# Patient Record
Sex: Male | Born: 1965 | Race: White | Hispanic: No | Marital: Single | Smoking: Current every day smoker
Health system: Southern US, Community
[De-identification: ages and names within clinical notes are randomized; demographics above are authoritative.]

## PROBLEM LIST (undated history)

## (undated) DIAGNOSIS — Z789 Other specified health status: Secondary | ICD-10-CM

## (undated) DIAGNOSIS — T8484XA Pain due to internal orthopedic prosthetic devices, implants and grafts, initial encounter: Secondary | ICD-10-CM

## (undated) DIAGNOSIS — N189 Chronic kidney disease, unspecified: Secondary | ICD-10-CM

## (undated) HISTORY — DX: Chronic kidney disease, unspecified: N18.9

---

## 1988-04-05 HISTORY — PX: HERNIA REPAIR: SHX51

## 1998-04-05 HISTORY — PX: ORIF ANKLE FRACTURE: SUR919

## 2002-01-06 ENCOUNTER — Inpatient Hospital Stay (HOSPITAL_COMMUNITY): Admission: AC | Admit: 2002-01-06 | Discharge: 2002-01-12 | Payer: Self-pay

## 2002-01-06 ENCOUNTER — Encounter: Payer: Self-pay | Admitting: Specialist

## 2003-04-06 HISTORY — PX: ORIF WRIST FRACTURE: SHX2133

## 2004-07-14 ENCOUNTER — Emergency Department (HOSPITAL_COMMUNITY): Admission: EM | Admit: 2004-07-14 | Discharge: 2004-07-14 | Payer: Self-pay | Admitting: Emergency Medicine

## 2004-07-16 ENCOUNTER — Inpatient Hospital Stay (HOSPITAL_COMMUNITY): Admission: RE | Admit: 2004-07-16 | Discharge: 2004-07-17 | Payer: Self-pay | Admitting: Orthopedic Surgery

## 2004-08-07 ENCOUNTER — Emergency Department (HOSPITAL_COMMUNITY): Admission: EM | Admit: 2004-08-07 | Discharge: 2004-08-07 | Payer: Self-pay | Admitting: Emergency Medicine

## 2012-08-01 ENCOUNTER — Encounter: Payer: Self-pay | Admitting: Medical

## 2012-10-05 ENCOUNTER — Other Ambulatory Visit: Payer: Self-pay | Admitting: Family Medicine

## 2012-10-05 DIAGNOSIS — N200 Calculus of kidney: Secondary | ICD-10-CM

## 2012-10-09 ENCOUNTER — Other Ambulatory Visit: Payer: Self-pay

## 2012-10-17 ENCOUNTER — Ambulatory Visit
Admission: RE | Admit: 2012-10-17 | Discharge: 2012-10-17 | Disposition: A | Discharge status: Home / Self Care | Payer: 59 | Source: Ambulatory Visit | Attending: Family Medicine | Admitting: Family Medicine

## 2012-10-17 DIAGNOSIS — N200 Calculus of kidney: Secondary | ICD-10-CM

## 2013-12-05 ENCOUNTER — Encounter (HOSPITAL_BASED_OUTPATIENT_CLINIC_OR_DEPARTMENT_OTHER): Payer: Self-pay | Admitting: *Deleted

## 2013-12-06 ENCOUNTER — Ambulatory Visit (HOSPITAL_BASED_OUTPATIENT_CLINIC_OR_DEPARTMENT_OTHER)
Admission: RE | Admit: 2013-12-06 | Discharge: 2013-12-06 | Disposition: A | Discharge status: Home / Self Care | Payer: 59 | Source: Ambulatory Visit | Attending: Orthopedic Surgery | Admitting: Orthopedic Surgery

## 2013-12-06 ENCOUNTER — Ambulatory Visit (HOSPITAL_BASED_OUTPATIENT_CLINIC_OR_DEPARTMENT_OTHER): Payer: 59 | Admitting: Anesthesiology

## 2013-12-06 ENCOUNTER — Encounter (HOSPITAL_BASED_OUTPATIENT_CLINIC_OR_DEPARTMENT_OTHER): Payer: Self-pay | Admitting: Anesthesiology

## 2013-12-06 ENCOUNTER — Encounter (HOSPITAL_BASED_OUTPATIENT_CLINIC_OR_DEPARTMENT_OTHER): Payer: 59 | Admitting: Anesthesiology

## 2013-12-06 ENCOUNTER — Encounter (HOSPITAL_BASED_OUTPATIENT_CLINIC_OR_DEPARTMENT_OTHER)
Admission: RE | Disposition: A | Discharge status: Home / Self Care | Payer: Self-pay | Source: Ambulatory Visit | Attending: Orthopedic Surgery

## 2013-12-06 DIAGNOSIS — M25579 Pain in unspecified ankle and joints of unspecified foot: Secondary | ICD-10-CM | POA: Diagnosis not present

## 2013-12-06 DIAGNOSIS — R52 Pain, unspecified: Secondary | ICD-10-CM | POA: Diagnosis not present

## 2013-12-06 DIAGNOSIS — T8484XA Pain due to internal orthopedic prosthetic devices, implants and grafts, initial encounter: Secondary | ICD-10-CM | POA: Diagnosis present

## 2013-12-06 DIAGNOSIS — T8484XS Pain due to internal orthopedic prosthetic devices, implants and grafts, sequela: Secondary | ICD-10-CM

## 2013-12-06 DIAGNOSIS — Z87891 Personal history of nicotine dependence: Secondary | ICD-10-CM | POA: Insufficient documentation

## 2013-12-06 DIAGNOSIS — L988 Other specified disorders of the skin and subcutaneous tissue: Secondary | ICD-10-CM | POA: Diagnosis not present

## 2013-12-06 DIAGNOSIS — Z472 Encounter for removal of internal fixation device: Secondary | ICD-10-CM | POA: Insufficient documentation

## 2013-12-06 HISTORY — PX: HARDWARE REMOVAL: SHX979

## 2013-12-06 HISTORY — DX: Pain due to internal orthopedic prosthetic devices, implants and grafts, initial encounter: T84.84XA

## 2013-12-06 HISTORY — DX: Other specified health status: Z78.9

## 2013-12-06 LAB — POCT HEMOGLOBIN-HEMACUE: HEMOGLOBIN: 15.1 g/dL (ref 13.0–17.0)

## 2013-12-06 SURGERY — REMOVAL, HARDWARE
Anesthesia: General | Site: Ankle | Laterality: Right

## 2013-12-06 MED ORDER — LACTATED RINGERS IV SOLN
INTRAVENOUS | Status: DC
  Administered 2013-12-06: 09:00:00 via INTRAVENOUS

## 2013-12-06 MED ORDER — HYDROMORPHONE HCL PF 1 MG/ML IJ SOLN
INTRAMUSCULAR | Status: AC
  Filled 2013-12-06: qty 1

## 2013-12-06 MED ORDER — SENNA-DOCUSATE SODIUM 8.6-50 MG PO TABS: 2.0000 | ORAL_TABLET | Freq: Every day | ORAL | Status: DC

## 2013-12-06 MED ORDER — FENTANYL CITRATE 0.05 MG/ML IJ SOLN
INTRAMUSCULAR | Status: AC
  Filled 2013-12-06: qty 6

## 2013-12-06 MED ORDER — ONDANSETRON HCL 4 MG/2ML IJ SOLN: 4.0000 mg | Freq: Once | INTRAMUSCULAR | Status: DC | PRN

## 2013-12-06 MED ORDER — OXYCODONE HCL 5 MG PO TABS
ORAL_TABLET | ORAL | Status: AC
  Filled 2013-12-06: qty 1

## 2013-12-06 MED ORDER — CEFAZOLIN SODIUM-DEXTROSE 2-3 GM-% IV SOLR
INTRAVENOUS | Status: AC
  Filled 2013-12-06: qty 50

## 2013-12-06 MED ORDER — MIDAZOLAM HCL 2 MG/2ML IJ SOLN
INTRAMUSCULAR | Status: AC
  Filled 2013-12-06: qty 2

## 2013-12-06 MED ORDER — ONDANSETRON HCL 4 MG/2ML IJ SOLN
INTRAMUSCULAR | Status: DC | PRN
  Administered 2013-12-06: 4 mg via INTRAVENOUS

## 2013-12-06 MED ORDER — FENTANYL CITRATE 0.05 MG/ML IJ SOLN
INTRAMUSCULAR | Status: DC | PRN
  Administered 2013-12-06: 100 ug via INTRAVENOUS

## 2013-12-06 MED ORDER — BUPIVACAINE HCL (PF) 0.5 % IJ SOLN
INTRAMUSCULAR | Status: DC | PRN
  Administered 2013-12-06: 19 mL

## 2013-12-06 MED ORDER — FENTANYL CITRATE 0.05 MG/ML IJ SOLN: 50.0000 ug | INTRAMUSCULAR | Status: DC | PRN

## 2013-12-06 MED ORDER — CEFAZOLIN SODIUM-DEXTROSE 2-3 GM-% IV SOLR
2.0000 g | INTRAVENOUS | Status: AC
  Administered 2013-12-06: 2 g via INTRAVENOUS

## 2013-12-06 MED ORDER — HYDROMORPHONE HCL PF 1 MG/ML IJ SOLN
0.2500 mg | INTRAMUSCULAR | Status: DC | PRN
  Administered 2013-12-06 (×3): 0.5 mg via INTRAVENOUS

## 2013-12-06 MED ORDER — OXYCODONE HCL 5 MG/5ML PO SOLN: 5.0000 mg | Freq: Once | ORAL | Status: AC | PRN

## 2013-12-06 MED ORDER — PROPOFOL 10 MG/ML IV BOLUS
INTRAVENOUS | Status: DC | PRN
  Administered 2013-12-06: 160 mg via INTRAVENOUS

## 2013-12-06 MED ORDER — LIDOCAINE HCL (CARDIAC) 20 MG/ML IV SOLN
INTRAVENOUS | Status: DC | PRN
  Administered 2013-12-06: 60 mg via INTRAVENOUS

## 2013-12-06 MED ORDER — CEPHALEXIN 500 MG PO CAPS: 500.0000 mg | ORAL_CAPSULE | Freq: Four times a day (QID) | ORAL | Status: DC

## 2013-12-06 MED ORDER — ONDANSETRON HCL 4 MG PO TABS: 4.0000 mg | ORAL_TABLET | Freq: Three times a day (TID) | ORAL | Status: DC | PRN

## 2013-12-06 MED ORDER — MEPERIDINE HCL 25 MG/ML IJ SOLN
6.2500 mg | INTRAMUSCULAR | Status: DC | PRN
Start: 2013-12-06 — End: 2013-12-06

## 2013-12-06 MED ORDER — MIDAZOLAM HCL 2 MG/2ML IJ SOLN: 1.0000 mg | INTRAMUSCULAR | Status: DC | PRN

## 2013-12-06 MED ORDER — OXYCODONE HCL 5 MG PO TABS
5.0000 mg | ORAL_TABLET | Freq: Once | ORAL | Status: AC | PRN
  Administered 2013-12-06: 5 mg via ORAL

## 2013-12-06 MED ORDER — OXYCODONE-ACETAMINOPHEN 5-325 MG PO TABS: 1.0000 | ORAL_TABLET | Freq: Four times a day (QID) | ORAL | Status: DC | PRN

## 2013-12-06 MED ORDER — DEXAMETHASONE SODIUM PHOSPHATE 4 MG/ML IJ SOLN
INTRAMUSCULAR | Status: DC | PRN
  Administered 2013-12-06: 10 mg via INTRAVENOUS

## 2013-12-06 MED ORDER — MIDAZOLAM HCL 5 MG/5ML IJ SOLN
INTRAMUSCULAR | Status: DC | PRN
  Administered 2013-12-06: 2 mg via INTRAVENOUS

## 2013-12-06 SURGICAL SUPPLY — 67 items
BAG DECANTER FOR FLEXI CONT (MISCELLANEOUS) IMPLANT
BANDAGE ELASTIC 4 VELCRO ST LF (GAUZE/BANDAGES/DRESSINGS) ×2 IMPLANT
BANDAGE ELASTIC 6 VELCRO ST LF (GAUZE/BANDAGES/DRESSINGS) IMPLANT
BANDAGE ESMARK 6X9 LF (GAUZE/BANDAGES/DRESSINGS) ×1 IMPLANT
BENZOIN TINCTURE PRP APPL 2/3 (GAUZE/BANDAGES/DRESSINGS) ×2 IMPLANT
BLADE SURG 15 STRL LF DISP TIS (BLADE) ×3 IMPLANT
BLADE SURG 15 STRL SS (BLADE) ×3
BNDG ESMARK 6X9 LF (GAUZE/BANDAGES/DRESSINGS) ×2
BOOT STEPPER DURA MED (SOFTGOODS) ×2 IMPLANT
CANISTER SUCT 1200ML W/VALVE (MISCELLANEOUS) IMPLANT
CLSR STERI-STRIP ANTIMIC 1/2X4 (GAUZE/BANDAGES/DRESSINGS) ×2 IMPLANT
COVER TABLE BACK 60X90 (DRAPES) IMPLANT
CUFF TOURNIQUET SINGLE 18IN (TOURNIQUET CUFF) IMPLANT
CUFF TOURNIQUET SINGLE 34IN LL (TOURNIQUET CUFF) ×2 IMPLANT
DECANTER SPIKE VIAL GLASS SM (MISCELLANEOUS) IMPLANT
DRAPE EXTREMITY T 121X128X90 (DRAPE) ×2 IMPLANT
DRAPE INCISE IOBAN 66X45 STRL (DRAPES) IMPLANT
DRAPE OEC MINIVIEW 54X84 (DRAPES) ×2 IMPLANT
DRAPE U-SHAPE 47X51 STRL (DRAPES) ×2 IMPLANT
DRAPE U-SHAPE 76X120 STRL (DRAPES) IMPLANT
DURAPREP 26ML APPLICATOR (WOUND CARE) ×2 IMPLANT
ELECT REM PT RETURN 9FT ADLT (ELECTROSURGICAL) ×2
ELECTRODE REM PT RTRN 9FT ADLT (ELECTROSURGICAL) ×1 IMPLANT
GAUZE SPONGE 4X4 12PLY STRL (GAUZE/BANDAGES/DRESSINGS) ×2 IMPLANT
GAUZE XEROFORM 1X8 LF (GAUZE/BANDAGES/DRESSINGS) ×2 IMPLANT
GLOVE BIO SURGEON STRL SZ8 (GLOVE) ×2 IMPLANT
GLOVE BIOGEL PI IND STRL 7.0 (GLOVE) ×1 IMPLANT
GLOVE BIOGEL PI IND STRL 8 (GLOVE) ×3 IMPLANT
GLOVE BIOGEL PI INDICATOR 7.0 (GLOVE) ×1
GLOVE BIOGEL PI INDICATOR 8 (GLOVE) ×3
GLOVE ORTHO TXT STRL SZ7.5 (GLOVE) ×2 IMPLANT
GOWN STRL REUS W/ TWL LRG LVL3 (GOWN DISPOSABLE) ×1 IMPLANT
GOWN STRL REUS W/ TWL XL LVL3 (GOWN DISPOSABLE) ×2 IMPLANT
GOWN STRL REUS W/TWL LRG LVL3 (GOWN DISPOSABLE) ×1
GOWN STRL REUS W/TWL XL LVL3 (GOWN DISPOSABLE) ×2
NDL SUT 6 .5 CRC .975X.05 MAYO (NEEDLE) IMPLANT
NEEDLE MAYO TAPER (NEEDLE)
NS IRRIG 1000ML POUR BTL (IV SOLUTION) ×2 IMPLANT
PACK ARTHROSCOPY DSU (CUSTOM PROCEDURE TRAY) ×2 IMPLANT
PACK BASIN DAY SURGERY FS (CUSTOM PROCEDURE TRAY) ×2 IMPLANT
PAD CAST 4YDX4 CTTN HI CHSV (CAST SUPPLIES) IMPLANT
PADDING CAST COTTON 4X4 STRL (CAST SUPPLIES)
PADDING CAST COTTON 6X4 STRL (CAST SUPPLIES) IMPLANT
PENCIL BUTTON HOLSTER BLD 10FT (ELECTRODE) ×2 IMPLANT
SHEET MEDIUM DRAPE 40X70 STRL (DRAPES) ×2 IMPLANT
SLEEVE SCD COMPRESS KNEE MED (MISCELLANEOUS) ×2 IMPLANT
SPONGE LAP 4X18 X RAY DECT (DISPOSABLE) ×2 IMPLANT
STAPLER VISISTAT (STAPLE) IMPLANT
STAPLER VISISTAT 35W (STAPLE) IMPLANT
STOCKINETTE 6  STRL (DRAPES)
STOCKINETTE 6 STRL (DRAPES) IMPLANT
STOCKINETTE IMPERVIOUS LG (DRAPES) IMPLANT
SUCTION FRAZIER TIP 10 FR DISP (SUCTIONS) IMPLANT
SUT MNCRL AB 4-0 PS2 18 (SUTURE) IMPLANT
SUT VIC AB 0 CT1 27 (SUTURE)
SUT VIC AB 0 CT1 27XBRD ANBCTR (SUTURE) IMPLANT
SUT VIC AB 0 SH 27 (SUTURE) IMPLANT
SUT VIC AB 2-0 SH 27 (SUTURE) ×1
SUT VIC AB 2-0 SH 27XBRD (SUTURE) ×1 IMPLANT
SUT VICRYL 3-0 CR8 SH (SUTURE) ×2 IMPLANT
SWAB COLLECTION DEVICE MRSA (MISCELLANEOUS) ×2 IMPLANT
SYR BULB 3OZ (MISCELLANEOUS) ×2 IMPLANT
TOWEL OR 17X24 6PK STRL BLUE (TOWEL DISPOSABLE) ×2 IMPLANT
TUBE ANAEROBIC SPECIMEN COL (MISCELLANEOUS) ×2 IMPLANT
TUBE CONNECTING 20X1/4 (TUBING) IMPLANT
UNDERPAD 30X30 INCONTINENT (UNDERPADS AND DIAPERS) ×2 IMPLANT
YANKAUER SUCT BULB TIP NO VENT (SUCTIONS) IMPLANT

## 2013-12-06 NOTE — Anesthesia Preprocedure Evaluation (Signed)
Anesthesia Evaluation  Patient identified by MRN, date of birth, ID band Patient awake    Reviewed: Allergy & Precautions, H&P , NPO status , Patient's Chart, lab work & pertinent test results  Airway Mallampati: I TM Distance: >3 FB Neck ROM: Full    Dental   Pulmonary former smoker,          Cardiovascular     Neuro/Psych    GI/Hepatic   Endo/Other    Renal/GU      Musculoskeletal   Abdominal   Peds  Hematology   Anesthesia Other Findings   Reproductive/Obstetrics                           Anesthesia Physical Anesthesia Plan  ASA: II  Anesthesia Plan: General   Post-op Pain Management:    Induction: Intravenous  Airway Management Planned: LMA  Additional Equipment:   Intra-op Plan:   Post-operative Plan: Extubation in OR  Informed Consent: I have reviewed the patients History and Physical, chart, labs and discussed the procedure including the risks, benefits and alternatives for the proposed anesthesia with the patient or authorized representative who has indicated his/her understanding and acceptance.     Plan Discussed with: CRNA and Surgeon  Anesthesia Plan Comments:         Anesthesia Quick Evaluation  

## 2013-12-06 NOTE — Discharge Instructions (Signed)
Diet: As you were doing prior to hospitalization   Shower:  May shower but keep the wounds dry, use an occlusive plastic wrap, NO SOAKING IN TUB.  If the bandage gets wet, change with a clean dry gauze.  Dressing:  You may change your dressing 3-5 days after surgery.  Then change the dressing daily with sterile gauze dressing.    There are sticky tapes (steri-strips) on your wounds and all the stitches are absorbable.  Leave the steri-strips in place when changing your dressings, they will peel off with time, usually 2-3 weeks.  Activity:  Increase activity slowly as tolerated, but follow the weight bearing instructions below.  No lifting or driving for 6 weeks.  Weight Bearing:   As tolerated, but stay in boot at all times except sleeping.  .    To prevent constipation: you may use a stool softener such as -  Colace (over the counter) 100 mg by mouth twice a day  Drink plenty of fluids (prune juice may be helpful) and high fiber foods Miralax (over the counter) for constipation as needed.    Itching:  If you experience itching with your medications, try taking only a single pain pill, or even half a pain pill at a time.  You may take up to 10 pain pills per day, and you can also use benadryl over the counter for itching or also to help with sleep.   Precautions:  If you experience chest pain or shortness of breath - call 911 immediately for transfer to the hospital emergency department!!  If you develop a fever greater that 101 F, purulent drainage from wound, increased redness or drainage from wound, or calf pain -- Call the office at [phone]                                                Follow- Up Appointment:  Please call for an appointment to be seen in 2 weeks [facility] - [phone]  Discharge Instructions After Orthopedic Procedures:  *You may feel tired and weak following your procedure. It is recommended that you limit physical activity for the next 24 hours and rest at  home for the remainder of today and tomorrow. *No strenuous activity should be started without your doctor's permission.  Elevate the extremity that you had surgery on to a level above your heart. This should continue for 48 hours or as instructed by your doctor.  If you had hand, arm or shoulder surgery you should move your fingers frequently unless otherwise instructed by your doctor.  If you had foot, knee or leg surgery you should wiggle your toes frequently unless otherwise instructed by your doctor.  Follow your doctor's exact instructions for activity at home. Use your home equipment as instructed. (Crutches, hard shoes, slings etc.)  Limit your activity as instructed by your doctor.  Report to your doctor should any of the following occur: 1. Extreme swelling of your fingers or toes. 2. Inability to wiggle your fingers or toes. 3. Coldness, pale or bluish color in your fingers or toes. 4. Loss of sensation, numbness or tingling of your fingers or toes. 5. Unusual smell or odor from under your dressing or cast. 6. Excessive bleeding or drainage from the surgical site. 7. Pain not relieved by medication your doctor has prescribed for you. 8. Cast or dressing too tight (  do not get your dressing or cast wet or put anything under          your dressing or cast.)  *Do not change your dressing unless instructed by your doctor or discharge nurse. Then follow exact instructions.  *Follow labeled instructions for any medications that your doctor may have prescribed for you. *Should any questions or complications develop following your procedure, PLEASE CONTACT YOUR DOCTOR.   Post Anesthesia Home Care Instructions  Activity: Get plenty of rest for the remainder of the day. A responsible adult should stay with you for 24 hours following the procedure.  For the next 24 hours, DO NOT: -Drive a car -Advertising copywriter -Drink alcoholic beverages -Take any medication unless instructed by  your physician -Make any legal decisions or sign important papers.  Meals: Start with liquid foods such as gelatin or soup. Progress to regular foods as tolerated. Avoid greasy, spicy, heavy foods. If nausea and/or vomiting occur, drink only clear liquids until the nausea and/or vomiting subsides. Call your physician if vomiting continues.  Special Instructions/Symptoms: Your throat may feel dry or sore from the anesthesia or the breathing tube placed in your throat during surgery. If this causes discomfort, gargle with warm salt water. The discomfort should disappear within 24 hours.

## 2013-12-06 NOTE — H&P (Signed)
PREOPERATIVE H&P  Chief Complaint: Right Ankle: Retained Hardware  HPI: Brad [name] is a 48 y.o. male who presents for preoperative history and physical with a diagnosis of Right Ankle: Retained Hardware. Symptoms are rated as moderate to severe, and have been worsening.  This is significantly impairing activities of daily living.  He has elected for surgical management. He has had prominence of the hardware for a couple of years, the ankle lately has turned red and is painful over the screw heads.  Denies drainage or fevers or chills.  Past Medical History  Diagnosis Date  . Medical history non-contributory    Past Surgical History  Procedure Laterality Date  . Orif ankle fracture  2000    right  . Orif wrist fracture  2005    left  . Hernia repair  1990   History   Social History  . Marital Status: Single    Spouse Name: N/A    Number of Children: N/A  . Years of Education: N/A   Social History Main Topics  . Smoking status: Former Smoker    Quit date: 12/05/1993  . Smokeless tobacco: None  . Alcohol Use: No  . Drug Use: No  . Sexual Activity: None   Other Topics Concern  . None   Social History Narrative  . None   History reviewed. No pertinent family history. No Known Allergies Prior to Admission medications   Not on File     Positive ROS: All other systems have been reviewed and were otherwise negative with the exception of those mentioned in the HPI and as above.  Physical Exam: General: Alert, no acute distress Cardiovascular: No pedal edema Respiratory: No cyanosis, no use of accessory musculature GI: No organomegaly, abdomen is soft and non-tender Skin: No lesions in the area of chief complaint Neurologic: Sensation intact distally Psychiatric: Patient is competent for consent with normal mood and affect Lymphatic: No axillary or cervical lymphadenopathy  MUSCULOSKELETAL: right ankle with prominent hardware over the lateral incision.   Wounds closed, mild redness.  Assessment: Right Ankle: Retained Hardware with prominence irritating the soft tissue.  Plan: Plan for Procedure(s): RIGHT ANKLE: REMOVAL RETAINED HARDWARE   The risks benefits and alternatives were discussed with the patient including but not limited to the risks of nonoperative treatment, versus surgical intervention including infection, bleeding, recurrent fracture, incomplete relief of pain, nerve injury,  blood clots, cardiopulmonary complications, morbidity, mortality, among others, and they were willing to proceed.   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  Eulas Post, MD Cell [phone]   12/06/2013 10:07 AM

## 2013-12-06 NOTE — Op Note (Signed)
12/06/2013  11:59 AM  PATIENT:  Brad [name]    PRE-OPERATIVE DIAGNOSIS:  Right Ankle: Retained Hardware, with prominence, possible early cellulitis  POST-OPERATIVE DIAGNOSIS:  Same  PROCEDURE:  RIGHT ANKLE: REMOVAL RETAINED HARDWARE   SURGEON:  Eulas Post, MD  PHYSICIAN ASSISTANT: None  ANESTHESIA:   General  PREOPERATIVE INDICATIONS:  Brad [name] is a  48 y.o. male with a diagnosis of Right Ankle: Retained Hardware who failed conservative measures and elected for surgical management.  He had ORIF of the ankle done about 15 years ago, and initially had presented to my office with prominent hardware about a year and half ago, and I had planned on doing surgical intervention at that point, however he never completed the operative plan, but then re\re presented 2 days ago with redness and swelling and pain over the lateral side of the ankle. He elected for surgical management.  The risks benefits and alternatives were discussed with the patient preoperatively including but not limited to the risks of infection, bleeding, nerve injury, cardiopulmonary complications, the need for revision surgery, among others, and the patient was willing to proceed.  OPERATIVE IMPLANTS: I removed a titanium screws, 2 from the medial malleolus, and multiple over the lateral malleolus along with the plate.  OPERATIVE FINDINGS: There was some extreme bony ingrowth into the screws on the medial side making a very challenging to remove. The fracture had healed.  OPERATIVE PROCEDURE: The patient is brought to the operating room placed in supine position. General anesthesia was administered. IV antibiotics were given. The right lower extremity was prepped and draped in usual sterile fashion. Time out was performed. Incision was made medially over his previous incision, and the screw heads were exposed. I used C-arm to localize. I then removed the screws, however the posterior most screw had  significant bony ingrowth was extremely difficult to remove almost to the screw head off, but I was ultimately able to remove the screw and its shaft entirety.  The second screw was more easily removed, and I irrigated and closed that wound, as there was no redness or irritation over that side. Once that was closed I turned my attention to the lateral side, made an incision along the previous scar, expose the plate and screws, removed these without significant difficulty. It plate interestingly was actually strapped to the fibula using an Ethibond suture, which I did break in order to excise the plate.  I then curetted all of the screw holes, and removed any bursal tissue that was overlying the previous screw heads, I had also already taken cultures upon first exposure of the screw heads, where there was a small blush of fluid.  I irrigated the wounds copiously, and then repaired the deep tissue with 2-0 Vicryl followed by 3-0 Vicryl for subcutaneous tissue with Steri-Strips and sterile gauze and a soft dressing and I also applied a cam boot. I injected his wound prior to closure as well. He tolerated the procedure well and there were no complications. He will plan to be weightbearing as tolerated in the cam boot.

## 2013-12-06 NOTE — Anesthesia Procedure Notes (Signed)
Procedure Name: LMA Insertion Performed by: Zoeie Ritter W Pre-anesthesia Checklist: Patient identified, Timeout performed, Emergency Drugs available, Suction available and Patient being monitored Patient Re-evaluated:Patient Re-evaluated prior to inductionOxygen Delivery Method: Circle system utilized Preoxygenation: Pre-oxygenation with 100% oxygen Intubation Type: IV induction Ventilation: Mask ventilation without difficulty LMA: LMA inserted LMA Size: 4.0 Number of attempts: 1 Placement Confirmation: breath sounds checked- equal and bilateral and positive ETCO2 Tube secured with: Tape Dental Injury: Teeth and Oropharynx as per pre-operative assessment      

## 2013-12-06 NOTE — Transfer of Care (Signed)
Immediate Anesthesia Transfer of Care Note  Patient: Brad [name]  Procedure(s) Performed: Procedure(s): RIGHT ANKLE: REMOVAL RETAINED HARDWARE  (Right)  Patient Location: PACU  Anesthesia Type:General  Level of Consciousness: awake and alert   Airway & Oxygen Therapy: Patient Spontanous Breathing and Patient connected to face mask oxygen  Post-op Assessment: Report given to PACU RN and Post -op Vital signs reviewed and stable  Post vital signs: Reviewed and stable  Complications: No apparent anesthesia complications

## 2013-12-07 ENCOUNTER — Encounter (HOSPITAL_BASED_OUTPATIENT_CLINIC_OR_DEPARTMENT_OTHER): Payer: Self-pay | Admitting: Orthopedic Surgery

## 2013-12-08 NOTE — Anesthesia Postprocedure Evaluation (Signed)
Anesthesia Post Note  Patient: Brad [name]  Procedure(s) Performed: Procedure(s) (LRB): RIGHT ANKLE: REMOVAL RETAINED HARDWARE  (Right)  Anesthesia type: general  Patient location: PACU  Post pain: Pain level controlled  Post assessment: Patient's Cardiovascular Status Stable  Last Vitals:  Filed Vitals:   12/06/13 1342  BP: 127/81  Pulse: 70  Temp: 36.5 C  Resp: 16    Post vital signs: Reviewed and stable  Level of consciousness: sedated  Complications: No apparent anesthesia complications

## 2013-12-09 LAB — WOUND CULTURE

## 2013-12-12 LAB — ANAEROBIC CULTURE

## 2015-01-25 ENCOUNTER — Ambulatory Visit (INDEPENDENT_AMBULATORY_CARE_PROVIDER_SITE_OTHER): Payer: Commercial Managed Care - HMO | Admitting: Physician Assistant

## 2015-01-25 VITALS — BP 160/90 | HR 78 | Temp 98.2°F | Resp 18 | Ht 63.0 in | Wt 138.4 lb

## 2015-01-25 DIAGNOSIS — N200 Calculus of kidney: Secondary | ICD-10-CM | POA: Diagnosis not present

## 2015-01-25 DIAGNOSIS — R109 Unspecified abdominal pain: Secondary | ICD-10-CM

## 2015-01-25 LAB — POCT URINALYSIS DIP (MANUAL ENTRY)
Bilirubin, UA: NEGATIVE
Glucose, UA: NEGATIVE
Ketones, POC UA: NEGATIVE
Leukocytes, UA: NEGATIVE
Nitrite, UA: NEGATIVE
PH UA: 7
PROTEIN UA: NEGATIVE
Spec Grav, UA: 1.02
Urobilinogen, UA: 0.2

## 2015-01-25 LAB — POC MICROSCOPIC URINALYSIS (UMFC): Mucus: ABSENT

## 2015-01-25 MED ORDER — ONDANSETRON HCL 4 MG PO TABS: 4.0000 mg | ORAL_TABLET | Freq: Three times a day (TID) | ORAL | Status: DC | PRN

## 2015-01-25 MED ORDER — HYDROCODONE-ACETAMINOPHEN 5-325 MG PO TABS: 1.0000 | ORAL_TABLET | Freq: Four times a day (QID) | ORAL | Status: DC | PRN

## 2015-01-25 MED ORDER — TAMSULOSIN HCL 0.4 MG PO CAPS: 0.4000 mg | ORAL_CAPSULE | Freq: Every day | ORAL | Status: DC

## 2015-01-25 NOTE — Progress Notes (Signed)
   Subjective:    Patient ID: Brad [name], male    DOB: 1965/08/13, 49 y.o.   MRN: [id number]  HPI Patient presents for left sided flank pain, urinary frequency, urinary pressure, and discomfort wth urination since yesterday. Feels similar to when he has kidney stones. Has abdominal pain when urinating. Additionally endorses nausea and decreased appetite, but denies vomiting, fever, constipation, and diarrhea. Has had to have lithotripsy in the past due to obstruction. NKDA.   Review of Systems As noted above.    Objective:   Physical Exam  Constitutional: He is oriented to person, place, and time. He appears well-developed and well-nourished. No distress.  Blood pressure 160/90, pulse 78, temperature 98.2 F (36.8 C), temperature source Oral, resp. rate 18, height 5\' 3"  (1.6 m), weight 138 lb 6.4 oz (62.778 kg), SpO2 98 %.  HENT:  Head: Normocephalic and atraumatic.  Right Ear: External ear normal.  Left Ear: External ear normal.  Eyes: Conjunctivae are normal. Right eye exhibits no discharge. Left eye exhibits no discharge. No scleral icterus.  Cardiovascular: Normal rate, regular rhythm and normal heart sounds.  Exam reveals no gallop and no friction rub.   No murmur heard. Pulmonary/Chest: Effort normal and breath sounds normal. No respiratory distress. He has no wheezes. He has no rales. He exhibits no tenderness.  Abdominal: Soft. Bowel sounds are normal. He exhibits no distension and no mass. There is no hepatosplenomegaly. There is generalized tenderness (generalized, but worse on left). There is CVA tenderness (left). There is no rebound. No hernia.  Neurological: He is alert and oriented to person, place, and time.  Skin: Skin is warm and dry. No rash noted. He is not diaphoretic. No erythema. No pallor.  Psychiatric: He has a normal mood and affect. His behavior is normal. Judgment and thought content normal.   Results for orders placed or performed in visit on  01/25/15  POCT urinalysis dipstick  Result Value Ref Range   Color, UA yellow yellow   Clarity, UA clear clear   Glucose, UA negative negative   Bilirubin, UA negative negative   Ketones, POC UA negative negative   Spec Grav, UA 1.020    Blood, UA large (A) negative   pH, UA 7.0    Protein Ur, POC negative negative   Urobilinogen, UA 0.2    Nitrite, UA Negative Negative   Leukocytes, UA Negative Negative  POCT Microscopic Urinalysis ([facility])  Result Value Ref Range   WBC,UR,HPF,POC Few (A) None WBC/hpf   RBC,UR,HPF,POC Moderate (A) None RBC/hpf   Bacteria None None, Too numerous to count   Mucus Absent Absent   Epithelial Cells, UR Per Microscopy None None, Too numerous to count cells/hpf      Assessment & Plan:  1. Kidney stone Increase water intake. Warning signs discussed.  - tamsulosin (FLOMAX) 0.4 MG CAPS capsule; Take 1 capsule (0.4 mg total) by mouth daily.  Dispense: 30 capsule; Refill: 3 - ondansetron (ZOFRAN) 4 MG tablet; Take 1 tablet (4 mg total) by mouth every 8 (eight) hours as needed for nausea or vomiting.  Dispense: 20 tablet; Refill: 0 - HYDROcodone-acetaminophen (NORCO) 5-325 MG tablet; Take 1 tablet by mouth every 6 (six) hours as needed for moderate pain.  Dispense: 30 tablet; Refill: 0  2. Flank pain - POCT urinalysis dipstick - POCT Microscopic Urinalysis ([facility])   Janan Ridgeishira Genni Buske PA-C  [facility] 01/25/2015 11:38 AM

## 2015-06-08 ENCOUNTER — Emergency Department (HOSPITAL_COMMUNITY)
Admission: EM | Admit: 2015-06-08 | Discharge: 2015-06-08 | Disposition: A | Discharge status: Home / Self Care | Payer: Commercial Managed Care - HMO | Source: Home / Self Care | Attending: Family Medicine | Admitting: Family Medicine

## 2015-06-08 ENCOUNTER — Encounter (HOSPITAL_COMMUNITY): Payer: Self-pay | Admitting: Emergency Medicine

## 2015-06-08 DIAGNOSIS — N201 Calculus of ureter: Secondary | ICD-10-CM

## 2015-06-08 LAB — POCT URINALYSIS DIP (DEVICE)
Bilirubin Urine: NEGATIVE
Glucose, UA: NEGATIVE mg/dL
KETONES UR: NEGATIVE mg/dL
Leukocytes, UA: NEGATIVE
NITRITE: NEGATIVE
PH: 6.5 (ref 5.0–8.0)
PROTEIN: NEGATIVE mg/dL
Specific Gravity, Urine: 1.02 (ref 1.005–1.030)
Urobilinogen, UA: 0.2 mg/dL (ref 0.0–1.0)

## 2015-06-08 MED ORDER — HYDROCODONE-ACETAMINOPHEN 5-325 MG PO TABS: 1.0000 | ORAL_TABLET | Freq: Four times a day (QID) | ORAL | Status: DC | PRN

## 2015-06-08 MED ORDER — TAMSULOSIN HCL 0.4 MG PO CAPS: 0.4000 mg | ORAL_CAPSULE | Freq: Every day | ORAL | Status: DC

## 2015-06-08 NOTE — ED Notes (Signed)
Pain while urinating. Onset yesterday of symptoms.

## 2015-06-08 NOTE — ED Notes (Signed)
Instructed to follow up with urologist first thing in the morning

## 2015-06-08 NOTE — Discharge Instructions (Signed)
Drink plenty of fluids and call your urologist in am for recheck, sooner tonight if problem gets worse

## 2015-06-08 NOTE — ED Provider Notes (Signed)
CSN: [id number]     Arrival date & time 06/08/15  1301 History   First MD Initiated Contact with Patient 06/08/15 1325     Chief Complaint  Patient presents with  . Urinary Tract Infection   (Consider location/radiation/quality/duration/timing/severity/associated sxs/prior Treatment) Patient is a 50 y.o. male presenting with urinary tract infection. The history is provided by the patient.  Urinary Tract Infection This is a new problem. The current episode started yesterday. The problem has not changed since onset.Associated symptoms include abdominal pain. Pertinent negatives include no chest pain, no headaches and no shortness of breath. Associated symptoms comments: H/o stones sev on right, none in sev yrs..    Past Medical History  Diagnosis Date  . Medical history non-contributory   . Painful orthopaedic hardware, right ankle 12/06/2013  . Chronic kidney disease    Past Surgical History  Procedure Laterality Date  . Orif ankle fracture  2000    right  . Orif wrist fracture  2005    left  . Hernia repair  1990  . Hardware removal Right 12/06/2013    Procedure: RIGHT ANKLE: REMOVAL RETAINED HARDWARE ;  Surgeon: Eulas Post, MD;  Location: [facility];  Service: Orthopedics;  Laterality: Right;   No family history on file. Social History  Substance Use Topics  . Smoking status: Former Smoker    Quit date: 12/05/1993  . Smokeless tobacco: None  . Alcohol Use: No    Review of Systems  HENT: Negative.   Respiratory: Negative for shortness of breath.   Cardiovascular: Negative for chest pain.  Gastrointestinal: Positive for nausea and abdominal pain. Negative for vomiting, diarrhea and constipation.  Genitourinary: Positive for flank pain. Negative for frequency, hematuria, penile swelling, scrotal swelling, penile pain and testicular pain.  Musculoskeletal: Negative.   Neurological: Negative for headaches.  All other systems reviewed and are  negative.   Allergies  Review of patient's allergies indicates no known allergies.  Home Medications   Prior to Admission medications   Medication Sig Start Date End Date Taking? Authorizing Provider  HYDROcodone-acetaminophen (NORCO/VICODIN) 5-325 MG tablet Take 1 tablet by mouth every 6 (six) hours as needed. 06/08/15   Linna Hoff, MD  ondansetron (ZOFRAN) 4 MG tablet Take 1 tablet (4 mg total) by mouth every 8 (eight) hours as needed for nausea or vomiting. 01/25/15   [name], PA-C  sennosides-docusate sodium (SENOKOT-S) 8.6-50 MG tablet Take 2 tablets by mouth daily. Patient not taking: Reported on 01/25/2015 12/06/13   Teryl Lucy, MD  tamsulosin (FLOMAX) 0.4 MG CAPS capsule Take 1 capsule (0.4 mg total) by mouth daily after supper. 06/08/15   Linna Hoff, MD   Meds Ordered and Administered this Visit  Medications - No data to display  BP 133/84 mmHg  Pulse 63  Temp(Src) 98.2 F (36.8 C) (Oral)  Resp 14  SpO2 100% No data found.   Physical Exam  Constitutional: He is oriented to person, place, and time. He appears well-developed and well-nourished. No distress.  Cardiovascular: Normal rate.   Pulmonary/Chest: Effort normal.  Abdominal: Soft. Bowel sounds are normal. He exhibits no distension and no mass. There is tenderness. There is no rebound and no guarding.  Neurological: He is alert and oriented to person, place, and time.  Skin: Skin is warm and dry.  Nursing note and vitals reviewed.   ED Course  Procedures (including critical care time)  Labs Review Labs Reviewed  POCT URINALYSIS DIP (DEVICE) - Abnormal; Notable  for the following:    Hgb urine dipstick MODERATE (*)    All other components within normal limits   U/a pos bld,  Imaging Review No results found.   Visual Acuity Review  Right Eye Distance:   Left Eye Distance:   Bilateral Distance:    Right Eye Near:   Left Eye Near:    Bilateral Near:         MDM   1. Right  ureteral stone        [name], MD 06/08/15 1410

## 2015-11-18 ENCOUNTER — Encounter (HOSPITAL_COMMUNITY): Payer: Self-pay | Admitting: Emergency Medicine

## 2015-11-18 ENCOUNTER — Emergency Department (HOSPITAL_COMMUNITY)
Admission: EM | Admit: 2015-11-18 | Discharge: 2015-11-19 | Disposition: A | Discharge status: Home / Self Care | Payer: Commercial Managed Care - HMO | Attending: Emergency Medicine | Admitting: Emergency Medicine

## 2015-11-18 DIAGNOSIS — K5900 Constipation, unspecified: Secondary | ICD-10-CM | POA: Diagnosis not present

## 2015-11-18 DIAGNOSIS — Z87891 Personal history of nicotine dependence: Secondary | ICD-10-CM | POA: Diagnosis not present

## 2015-11-18 DIAGNOSIS — N189 Chronic kidney disease, unspecified: Secondary | ICD-10-CM | POA: Diagnosis not present

## 2015-11-18 MED ORDER — DIAZEPAM 5 MG/ML IJ SOLN
2.5000 mg | Freq: Once | INTRAMUSCULAR | Status: AC
  Administered 2015-11-19: 2.5 mg via INTRAVENOUS
  Filled 2015-11-18: qty 2

## 2015-11-18 MED ORDER — LIDOCAINE HCL 2 % EX GEL
1.0000 "application " | Freq: Once | CUTANEOUS | Status: AC
  Administered 2015-11-19: 1 via TOPICAL
  Filled 2015-11-18: qty 20

## 2015-11-18 MED ORDER — SORBITOL 70 % SOLN
960.0000 mL | TOPICAL_OIL | Freq: Once | ORAL | Status: AC
  Administered 2015-11-19: 960 mL via RECTAL
  Filled 2015-11-18: qty 240

## 2015-11-18 MED ORDER — SODIUM CHLORIDE 0.9 % IV BOLUS (SEPSIS)
1000.0000 mL | Freq: Once | INTRAVENOUS | Status: AC
  Administered 2015-11-19: 1000 mL via INTRAVENOUS

## 2015-11-18 NOTE — ED Triage Notes (Signed)
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  Pt st's can't remember when last BM was.

## 2015-11-18 NOTE — ED Provider Notes (Signed)
By signing my name below, I, [name], attest that this documentation has been prepared under the direction and in the presence of [name], DO. Electronically Signed: [name], ED Scribe. 11/18/15. 11:33 PM.   TIME SEEN: 11:22 PM   CHIEF COMPLAINT: Constipation   HPI:  Brad [name] is a 50 y.o. 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 Pt cannot remember his last BM. He reports associated abdominal cramping, subjective fever, and an episode of chest pain with SOB that occurred at 2:30 pm today while pt was straining during a BM. Describes the episode as feeling like his heart was racing. He adds that he had a near syncopal episode with that episode. He denies any current chest pain or palpitations. No alleviating factors noted. He reports that he has tried Milk of Magnesia and a home enema with no relief.  He is not on a regular bowel regimen. States was straining today he noticed a small amount of blood on the toilet.. No melena. He denies any n/v, or dysuria. Denies history of abdominal surgery, bowel obstruction.   ROS: See HPI Constitutional: subjective fever  Eyes: no drainage  ENT: no runny nose   Cardiovascular:   chest pain earlier today Resp: SOB earlier today GI: no vomiting GU: no dysuria Integumentary: no rash  Allergy: no hives  Musculoskeletal: no leg swelling  Neurological: no slurred speech ROS otherwise negative  PAST MEDICAL HISTORY/PAST SURGICAL HISTORY:  Past Medical History:  Diagnosis Date  . Chronic kidney disease   . Medical history non-contributory   . Painful orthopaedic hardware, right ankle 12/06/2013    MEDICATIONS:  Prior to Admission medications   Medication Sig Start Date End Date Taking? Authorizing Provider  HYDROcodone-acetaminophen (NORCO/VICODIN) 5-325 MG tablet Take 1 tablet by mouth  every 6 (six) hours as needed. 06/08/15   [name], MD  ondansetron (ZOFRAN) 4 MG tablet Take 1 tablet (4 mg total) by mouth every 8 (eight) hours as needed for nausea or vomiting. 01/25/15   [name], PA-C  sennosides-docusate sodium (SENOKOT-S) 8.6-50 MG tablet Take 2 tablets by mouth daily. Patient not taking: Reported on 01/25/2015 12/06/13   [name], MD  tamsulosin (FLOMAX) 0.4 MG CAPS capsule Take 1 capsule (0.4 mg total) by mouth daily after supper. 06/08/15   [name], MD    ALLERGIES:  No Known Allergies  SOCIAL HISTORY:  Social History  Substance Use Topics  . Smoking status: Former Smoker    Quit date: 12/05/1993  . Smokeless tobacco: Current User    Types: Snuff  . Alcohol use No    FAMILY HISTORY: No family history on file.  EXAM: BP 121/80   Pulse 76   Temp 98.4 F (36.9 C) (Oral)   Resp 20   Ht 5\' 4"  (1.626 m)   Wt 115 lb (52.2 kg)   SpO2 100%   BMI 19.74 kg/m  CONSTITUTIONAL: Alert and oriented and responds appropriately to questions. Well-appearing; well-nourished, Thin, in no distress, afebrile, nontoxic HEAD: Normocephalic EYES: Conjunctivae clear, PERRL ENT: normal nose; no rhinorrhea; moist mucous membranes NECK: Supple, no meningismus, no LAD  CARD: RRR; S1 and S2 appreciated; no murmurs, no clicks, no rubs, no gallops RESP: Normal chest excursion without splinting or tachypnea; breath sounds clear and equal bilaterally; no wheezes, no rhonchi, no rales, no hypoxia or respiratory distress, speaking full sentences  ABD/GI: Normal bowel sounds; non-distended; soft, Mildly TTP diffusely, no rebound, no guarding, no peritoneal signs RECTAL:  Normal rectal tone, no gross blood or melena, guaiac negative, no hemorrhoids appreciated, nontender rectal exam; patient has large amount of stool in the rectal vault but is unable to tolerate disimpaction BACK:  The back appears normal and is non-tender to palpation, there is no CVA  tenderness EXT: Normal ROM in all joints; non-tender to palpation; no edema; normal capillary refill; no cyanosis, no calf tenderness or swelling    SKIN: Normal color for age and race; warm; no rash NEURO: Moves all extremities equally, sensation to light touch intact diffusely, cranial nerves II through XII intact PSYCH: The patient's mood and manner are appropriate. Grooming and personal hygiene are appropriate.  MEDICAL DECISION MAKING: Patient here with constipation likely from being started on methadone recently without a bowel regimen. No history of abdominal surgeries or prior bowel obstruction. Abdomen is mildly tender diffusely but non-peritoneal and nondistended. Did have an episode of chest pain that he describes as palpitations or shortness of breath and a near syncopal event with straining on the toilet earlier today. Suspect vasovagal episode but will obtain cardiac labs, EKG and chest x-ray. We'll give patient IV fluids, medications to help him relax and he is requesting an enema in the emergency department. Refuses disimpaction at this time. Low suspicion for perforation, colitis, diverticulitis, volvulus, bowel obstruction or any other abdominal surgical process.  ED PROGRESS: 2:25 AM  Pt's workup in the emergency department has been unremarkable. X-ray shows no bowel obstruction, demonstrates constipation. Patient given a SMOG enema and has had a large bowel movement. Reports feeling better. Discussed with him that he needs to be on a regular bowel regimen given he is on methadone. We'll start him on Colace and MiraLAX both twice a day.   At this time, I do not feel there is any life-threatening condition present. I have reviewed and discussed all results (EKG, imaging, lab, urine as appropriate), exam findings with patient/family. I have reviewed nursing notes and appropriate previous records.  I feel the patient is safe to be discharged home without further emergent workup and can  continue workup as an outpatient as needed. Discussed usual and customary return precautions. Patient/family verbalize understanding and are comfortable with this plan.  Outpatient follow-up has been provided. All questions have been answered.   EKG Interpretation  Date/Time:  Tuesday November 18 2015 23:43:18 EDT Ventricular Rate:  64 PR Interval:    QRS Duration: 93 QT Interval:  433 QTC Calculation: 447 R Axis:   75 Text Interpretation:  Sinus rhythm Nonspecific T abnormalities, lateral leads No significant change since last tracing Confirmed by Chesney Klimaszewski,  DO, Kemya Shed [phone]) on 11/18/2015 11:56:53 PM        I personally performed the services described in this documentation, which was scribed in my presence. The recorded information has been reviewed and is accurate.     Layla MawKristen N Travanti Mcmanus, DO 11/19/15 [phone]

## 2015-11-19 ENCOUNTER — Emergency Department (HOSPITAL_COMMUNITY): Payer: Commercial Managed Care - HMO

## 2015-11-19 LAB — CBC WITH DIFFERENTIAL/PLATELET
BASOS ABS: 0 10*3/uL (ref 0.0–0.1)
BASOS PCT: 0 %
EOS ABS: 0 10*3/uL (ref 0.0–0.7)
EOS PCT: 0 %
HCT: 44.2 % (ref 39.0–52.0)
Hemoglobin: 15.5 g/dL (ref 13.0–17.0)
Lymphocytes Relative: 24 %
Lymphs Abs: 1.7 10*3/uL (ref 0.7–4.0)
MCH: 31.9 pg (ref 26.0–34.0)
MCHC: 35.1 g/dL (ref 30.0–36.0)
MCV: 90.9 fL (ref 78.0–100.0)
MONO ABS: 0.5 10*3/uL (ref 0.1–1.0)
Monocytes Relative: 6 %
Neutro Abs: 5.1 10*3/uL (ref 1.7–7.7)
Neutrophils Relative %: 70 %
PLATELETS: 140 10*3/uL — AB (ref 150–400)
RBC: 4.86 MIL/uL (ref 4.22–5.81)
RDW: 12.8 % (ref 11.5–15.5)
WBC: 7.3 10*3/uL (ref 4.0–10.5)

## 2015-11-19 LAB — URINALYSIS, ROUTINE W REFLEX MICROSCOPIC
BILIRUBIN URINE: NEGATIVE
GLUCOSE, UA: NEGATIVE mg/dL
Hgb urine dipstick: NEGATIVE
KETONES UR: 15 mg/dL — AB
LEUKOCYTES UA: NEGATIVE
Nitrite: NEGATIVE
PROTEIN: NEGATIVE mg/dL
Specific Gravity, Urine: 1.014 (ref 1.005–1.030)
pH: 7 (ref 5.0–8.0)

## 2015-11-19 LAB — COMPREHENSIVE METABOLIC PANEL
ALBUMIN: 4.4 g/dL (ref 3.5–5.0)
ALT: 163 U/L — ABNORMAL HIGH (ref 17–63)
ANION GAP: 8 (ref 5–15)
AST: 110 U/L — AB (ref 15–41)
Alkaline Phosphatase: 73 U/L (ref 38–126)
BUN: 14 mg/dL (ref 6–20)
CALCIUM: 9.7 mg/dL (ref 8.9–10.3)
CO2: 29 mmol/L (ref 22–32)
Chloride: 99 mmol/L — ABNORMAL LOW (ref 101–111)
Creatinine, Ser: 0.86 mg/dL (ref 0.61–1.24)
GFR calc Af Amer: >60 mL/min (ref >60)
Glucose, Bld: 97 mg/dL (ref 65–99)
POTASSIUM: 4.5 mmol/L (ref 3.5–5.1)
SODIUM: 136 mmol/L (ref 135–145)
TOTAL PROTEIN: 8.1 g/dL (ref 6.5–8.1)
Total Bilirubin: 0.8 mg/dL (ref 0.3–1.2)

## 2015-11-19 LAB — LIPASE, BLOOD: LIPASE: 14 U/L (ref 11–51)

## 2015-11-19 LAB — POC OCCULT BLOOD, ED: FECAL OCCULT BLD: NEGATIVE

## 2015-11-19 LAB — I-STAT TROPONIN, ED: Troponin i, poc: 0 ng/mL (ref 0.00–0.08)

## 2015-11-19 MED ORDER — DIAZEPAM 5 MG/ML IJ SOLN
2.5000 mg | Freq: Once | INTRAMUSCULAR | Status: AC
  Administered 2015-11-19: 2.5 mg via INTRAVENOUS
  Filled 2015-11-19: qty 2

## 2015-11-19 MED ORDER — POLYETHYLENE GLYCOL 3350 17 G PO PACK: 17.0000 g | PACK | Freq: Two times a day (BID) | ORAL | 6 refills | Status: DC

## 2015-11-19 MED ORDER — DOCUSATE SODIUM 100 MG PO CAPS: 100.0000 mg | ORAL_CAPSULE | Freq: Two times a day (BID) | ORAL | 6 refills | Status: DC

## 2015-11-19 NOTE — ED Notes (Signed)
Patient transported to X-ray 

## 2015-11-19 NOTE — ED Notes (Signed)
Dr. Ward at bedside.

## 2015-11-19 NOTE — Discharge Instructions (Signed)
Your labs, urine were normal today. Your x-ray showed constipation. I recommend you use Colace and MiraLAX every day to help with bowel movements while you are on methadone as opiate medications can cause constipation. Please only hold these medications if you began having diarrhea. Please increase your water and fiber intake. You may notice a small amount of bright red blood in your stool over the next several days. If you notice that you're passing large amounts of blood, clots or black and tarry appearing stools, began having worsening abdominal pain, distention of your abdomen, begin vomiting and cannot stop or have fever, please return to the hospital.

## 2016-06-10 ENCOUNTER — Other Ambulatory Visit (HOSPITAL_COMMUNITY): Payer: Self-pay | Admitting: Nurse Practitioner

## 2016-06-10 DIAGNOSIS — B1920 Unspecified viral hepatitis C without hepatic coma: Secondary | ICD-10-CM

## 2016-06-15 ENCOUNTER — Ambulatory Visit (HOSPITAL_COMMUNITY): Payer: Commercial Managed Care - HMO

## 2016-06-15 ENCOUNTER — Encounter (HOSPITAL_COMMUNITY): Payer: Self-pay

## 2016-07-07 ENCOUNTER — Other Ambulatory Visit (HOSPITAL_COMMUNITY): Payer: Commercial Managed Care - HMO

## 2016-07-07 ENCOUNTER — Ambulatory Visit (HOSPITAL_COMMUNITY): Admission: RE | Admit: 2016-07-07 | Payer: Commercial Managed Care - HMO | Source: Ambulatory Visit

## 2016-07-09 ENCOUNTER — Ambulatory Visit (HOSPITAL_COMMUNITY)
Admission: RE | Admit: 2016-07-09 | Discharge: 2016-07-09 | Disposition: A | Discharge status: Home / Self Care | Payer: Commercial Managed Care - HMO | Source: Ambulatory Visit | Attending: Nurse Practitioner | Admitting: Nurse Practitioner

## 2016-07-09 DIAGNOSIS — B182 Chronic viral hepatitis C: Secondary | ICD-10-CM | POA: Diagnosis present

## 2016-07-09 DIAGNOSIS — B1920 Unspecified viral hepatitis C without hepatic coma: Secondary | ICD-10-CM

## 2018-08-28 IMAGING — US US ABDOMEN COMPLETE W/ ELASTOGRAPHY
2 series · 13 of 25 positions shown · non-contrast
Comparison: CT abdomen/ pelvis dated 10/17/2012

CLINICAL DATA: Hepatitis C, diagnosed in [REDACTED]



[Series 1: us abdomen complete w/ elastography · 0.19mm/px · 11 of 90 slices shown (1 of 2)]
[im 1/90]
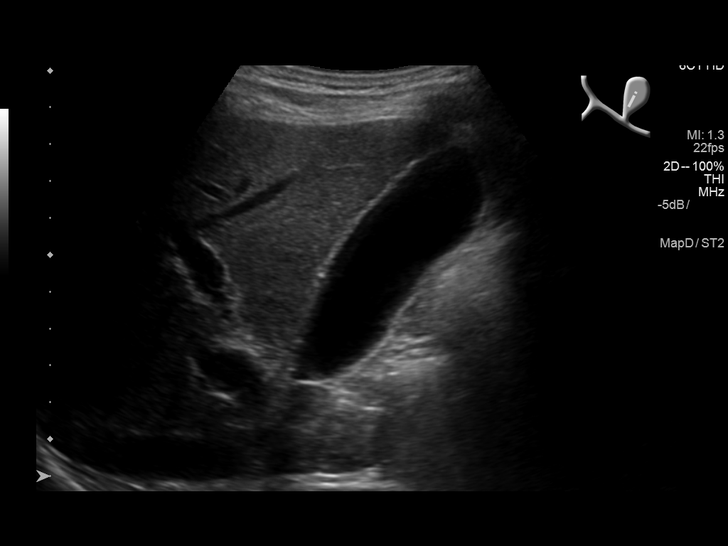
[im 9/90]
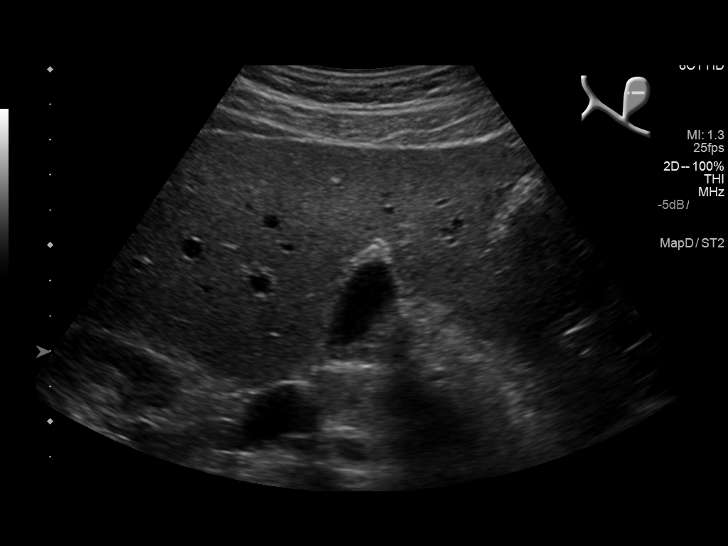
[im 17/90]
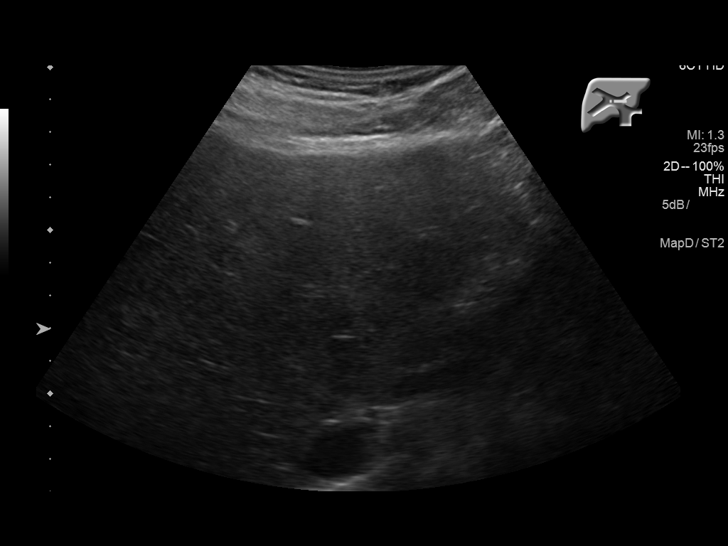
[im 26/90]
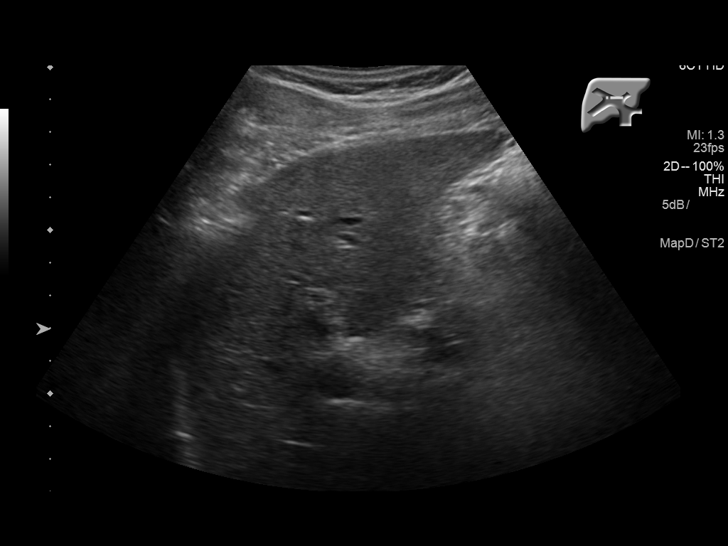
[im 34/90]
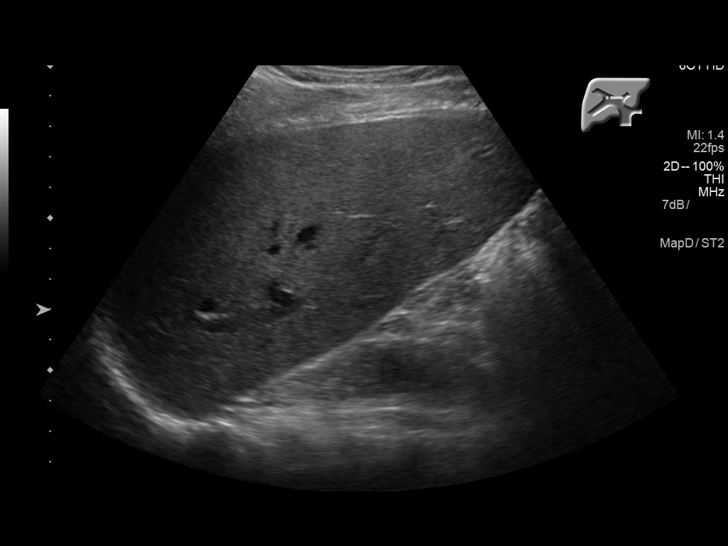
[im 43/90]
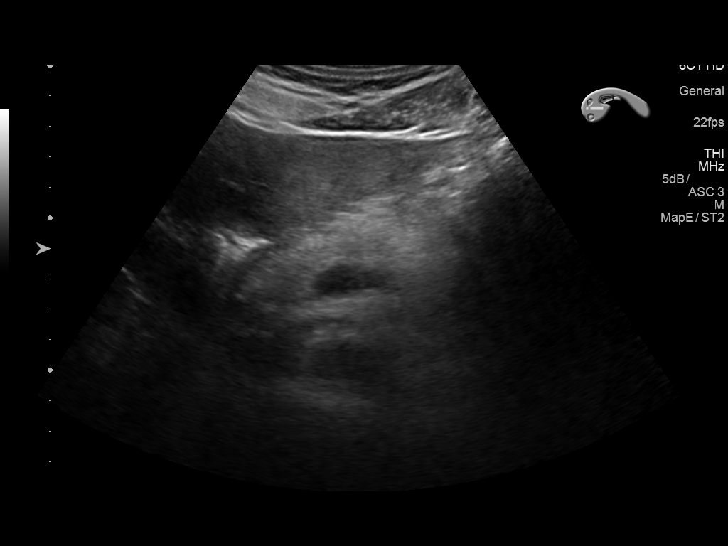
[im 51/90]
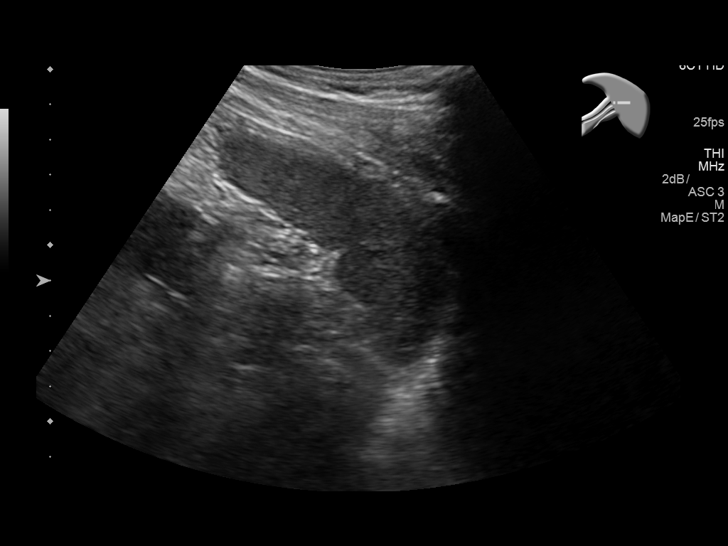
[im 60/90]
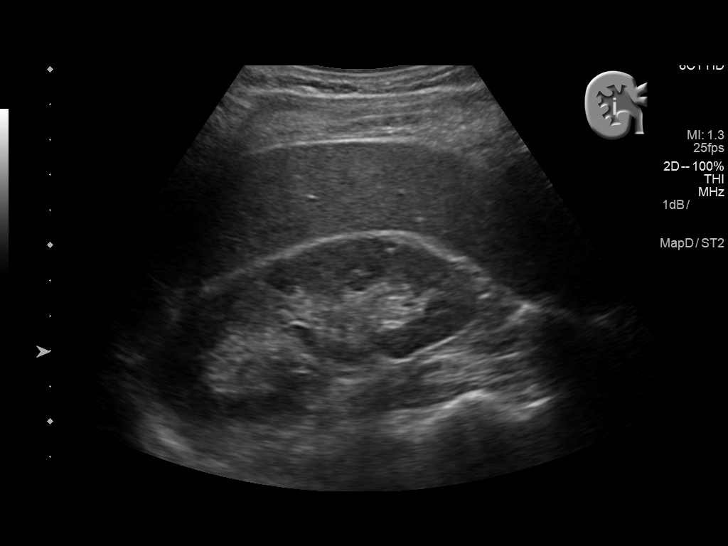
[im 68/90]
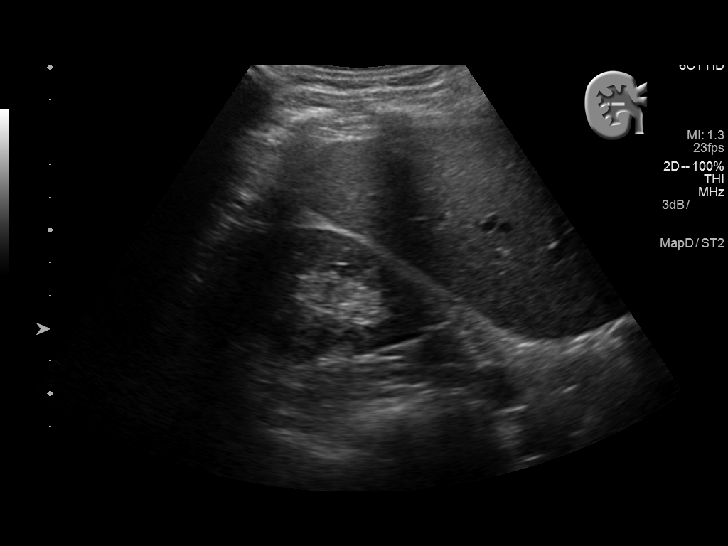
[im 77/90]
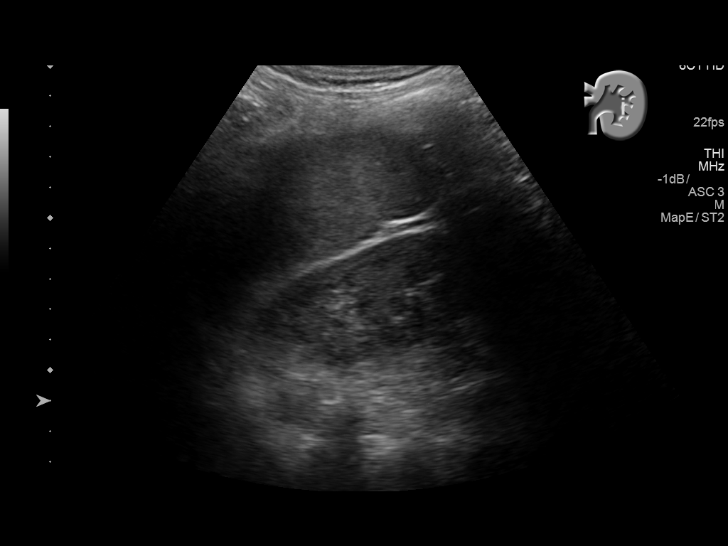
[im 85/90]
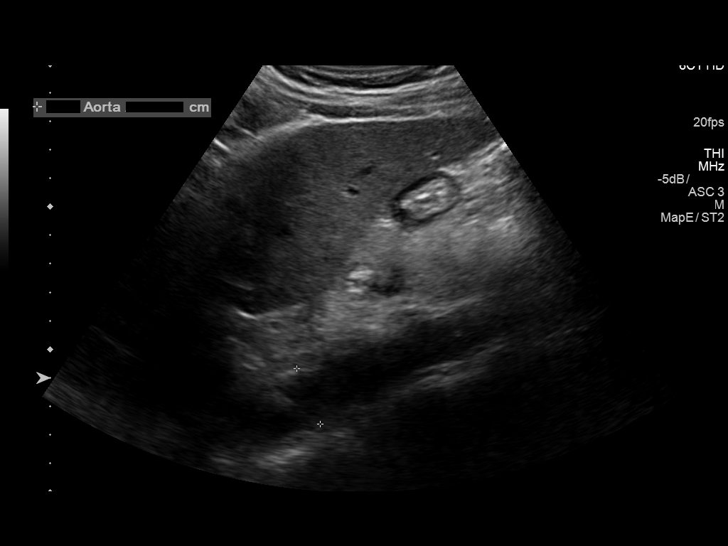

[Series 2002: us abdomen complete w/ elastography · 0.12mm/px · 2 of 13 slices shown (2 of 2)]
[im 1/13]
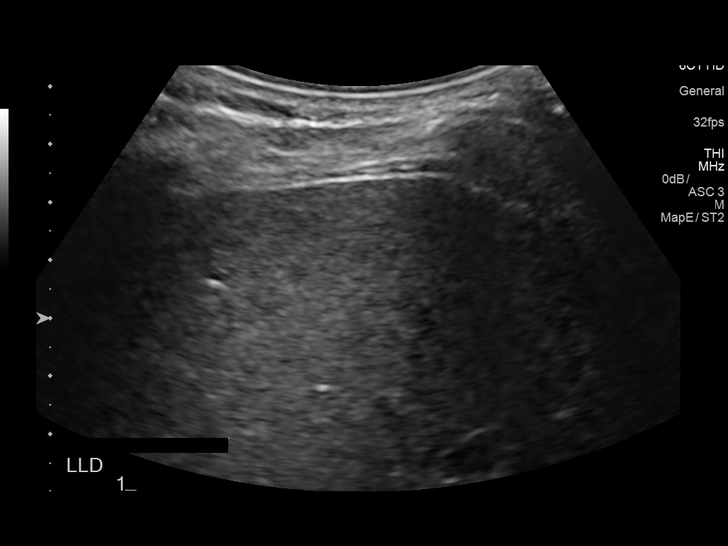
[im 13/13]
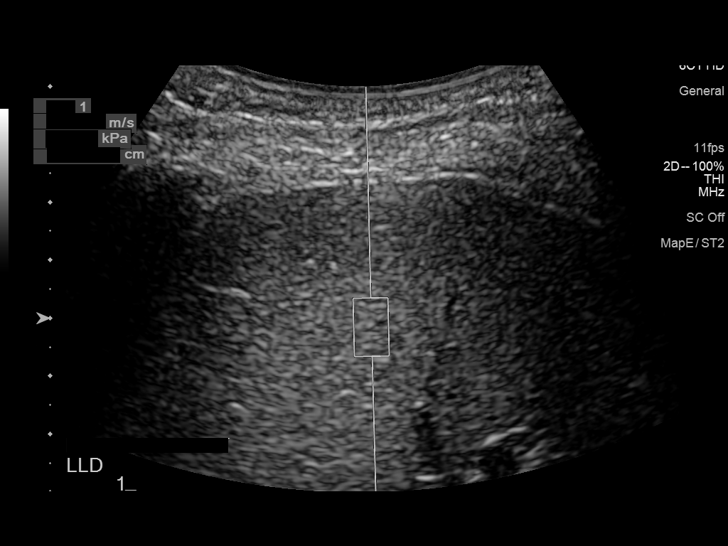

[13 of 25 positions shown; findings below may reference images not displayed]

FINDINGS: ULTRASOUND ABDOMEN

Gallbladder: No gallstones, gallbladder wall thickening, or
pericholecystic fluid.

Common bile duct: Diameter: 3 mm

Liver: No focal lesion identified. Within normal limits in
parenchymal echogenicity.

IVC: No abnormality visualized.

Pancreas: Poorly visualized due to overlying bowel gas.

Spleen: Size and appearance within normal limits.

Right Kidney: Length: 10.4 cm.  No mass or hydronephrosis.

Left Kidney: Length: 10.2 cm.  No mass or hydronephrosis.

Abdominal aorta: No aneurysm visualized.

Other findings: None.

ULTRASOUND HEPATIC ELASTOGRAPHY

Device: Siemens Helix VTQ

Patient position: Left Lateral Decubitus

Transducer 6C1

Number of measurements: 10

Hepatic segment:  8

Median velocity:   2.06  m/sec

IQR:

IQR/Median velocity ratio:

Corresponding Metavir fibrosis score:  F2 + some F3

Risk of fibrosis: Moderate

Limitations of exam: None

Pertinent findings noted on other imaging exams:  None

Please note that abnormal shear wave velocities may also be
identified in clinical settings other than with hepatic fibrosis,
such as: acute hepatitis, elevated right heart and central venous
pressures including use of beta blockers, Klpigbb disease
([place]), infiltrative processes such as
mastocytosis/amyloidosis/infiltrative tumor, extrahepatic
cholestasis, in the post-prandial state, and liver transplantation.
Correlation with patient history, laboratory data, and clinical
condition recommended.
IMPRESSION: ULTRASOUND ABDOMEN:
Unremarkable abdominal ultrasound.

ULTRASOUND HEPATIC ELASTOGRAPHY:

Median hepatic shear wave velocity is calculated at 2.06 m/sec.

Corresponding Metavir fibrosis score is  F2 + some F3.

Risk of fibrosis is Moderate.

Follow-up: Additional testing appropriate

## 2019-01-05 ENCOUNTER — Emergency Department (HOSPITAL_COMMUNITY)
Admission: EM | Admit: 2019-01-05 | Discharge: 2019-01-05 | Disposition: A | Discharge status: Home / Self Care | Payer: Self-pay | Attending: Emergency Medicine | Admitting: Emergency Medicine

## 2019-01-05 ENCOUNTER — Emergency Department (HOSPITAL_COMMUNITY): Payer: Self-pay

## 2019-01-05 DIAGNOSIS — F17220 Nicotine dependence, chewing tobacco, uncomplicated: Secondary | ICD-10-CM | POA: Insufficient documentation

## 2019-01-05 DIAGNOSIS — T50901A Poisoning by unspecified drugs, medicaments and biological substances, accidental (unintentional), initial encounter: Secondary | ICD-10-CM | POA: Insufficient documentation

## 2019-01-05 DIAGNOSIS — N189 Chronic kidney disease, unspecified: Secondary | ICD-10-CM | POA: Insufficient documentation

## 2019-01-05 DIAGNOSIS — Z79899 Other long term (current) drug therapy: Secondary | ICD-10-CM | POA: Insufficient documentation

## 2019-01-05 LAB — COMPREHENSIVE METABOLIC PANEL
ALT: 28 U/L (ref 0–44)
AST: 37 U/L (ref 15–41)
Albumin: 4 g/dL (ref 3.5–5.0)
Alkaline Phosphatase: 70 U/L (ref 38–126)
Anion gap: 8 (ref 5–15)
BUN: 17 mg/dL (ref 6–20)
CO2: 21 mmol/L — ABNORMAL LOW (ref 22–32)
Calcium: 8.4 mg/dL — ABNORMAL LOW (ref 8.9–10.3)
Chloride: 108 mmol/L (ref 98–111)
Creatinine, Ser: 1.1 mg/dL (ref 0.61–1.24)
GFR calc Af Amer: >60 mL/min (ref >60)
GFR calc non Af Amer: >60 mL/min (ref >60)
Glucose, Bld: 230 mg/dL — ABNORMAL HIGH (ref 70–99)
Potassium: 3.5 mmol/L (ref 3.5–5.1)
Sodium: 137 mmol/L (ref 135–145)
Total Bilirubin: 0.3 mg/dL (ref 0.3–1.2)
Total Protein: 7.4 g/dL (ref 6.5–8.1)

## 2019-01-05 LAB — CBC WITH DIFFERENTIAL/PLATELET
Abs Immature Granulocytes: 0.02 10*3/uL (ref 0.00–0.07)
Basophils Absolute: 0 10*3/uL (ref 0.0–0.1)
Basophils Relative: 1 %
Eosinophils Absolute: 0.1 10*3/uL (ref 0.0–0.5)
Eosinophils Relative: 1 %
HCT: 42.2 % (ref 39.0–52.0)
Hemoglobin: 14.5 g/dL (ref 13.0–17.0)
Immature Granulocytes: 0 %
Lymphocytes Relative: 25 %
Lymphs Abs: 1.7 10*3/uL (ref 0.7–4.0)
MCH: 32.6 pg (ref 26.0–34.0)
MCHC: 34.4 g/dL (ref 30.0–36.0)
MCV: 94.8 fL (ref 80.0–100.0)
Monocytes Absolute: 0.3 10*3/uL (ref 0.1–1.0)
Monocytes Relative: 4 %
Neutro Abs: 4.6 10*3/uL (ref 1.7–7.7)
Neutrophils Relative %: 69 %
Platelets: 186 10*3/uL (ref 150–400)
RBC: 4.45 MIL/uL (ref 4.22–5.81)
RDW: 12.4 % (ref 11.5–15.5)
WBC: 6.6 10*3/uL (ref 4.0–10.5)
nRBC: 0 % (ref 0.0–0.2)

## 2019-01-05 LAB — SALICYLATE LEVEL: Salicylate Lvl: <7 mg/dL (ref 2.8–30.0)

## 2019-01-05 LAB — RAPID URINE DRUG SCREEN, HOSP PERFORMED
Amphetamines: NOT DETECTED
Barbiturates: NOT DETECTED
Benzodiazepines: POSITIVE — AB
Cocaine: NOT DETECTED
Opiates: NOT DETECTED
Tetrahydrocannabinol: NOT DETECTED

## 2019-01-05 LAB — ACETAMINOPHEN LEVEL: Acetaminophen (Tylenol), Serum: <10 ug/mL — ABNORMAL LOW (ref 10–30)

## 2019-01-05 LAB — ETHANOL: Alcohol, Ethyl (B): <10 mg/dL (ref <10)

## 2019-01-05 MED ORDER — ONDANSETRON HCL 4 MG/2ML IJ SOLN
4.0000 mg | Freq: Once | INTRAMUSCULAR | Status: AC
  Administered 2019-01-05: 17:00:00 4 mg via INTRAVENOUS
  Filled 2019-01-05: qty 2

## 2019-01-05 MED ORDER — SODIUM CHLORIDE 0.9 % IV BOLUS
1000.0000 mL | Freq: Once | INTRAVENOUS | Status: AC
  Administered 2019-01-05: 15:00:00 1000 mL via INTRAVENOUS

## 2019-01-05 NOTE — ED Triage Notes (Signed)
Pt to ED via EMS from car on the side of the road, found by bystander unconscious in car, apneic, [employer] administered 2mg  Naran and was affective. Pt has no memory of what happened. Denies drug use.

## 2019-01-05 NOTE — ED Notes (Signed)
Pt given water at request, denied wanting anything to eat when offered.

## 2019-01-05 NOTE — ED Notes (Addendum)
Small baggie of drugs found in patients pocket. Charge, security, and off duty cop made aware.

## 2019-01-05 NOTE — ED Notes (Signed)
Patient requested this RN call his father to come pick him up. Attempted to call pts father [phone]. No answer.

## 2019-01-05 NOTE — ED Notes (Signed)
Patient gave primary nurse a small baggie of a white substance-writer discarded/flushed substance in toilet

## 2019-01-05 NOTE — ED Provider Notes (Signed)
[facility] DEPT Provider Note   CSN: [id number] Arrival date & time: 01/05/19  1354     History   Chief Complaint Chief Complaint  Patient presents with  . Ingestion    possible    HPI Brad [name] is a 53 y.o. male.     Pt presents to the ED as a possible overdose.  Pt was found in his car by a bystander.  He was unconscious and apneic.  [employer] administered 2 mg of Narcan IN and pt woke up.  Pt denies using drugs.  He does not remember what happened.  He feels ok now.  No si.     Past Medical History:  Diagnosis Date  . Chronic kidney disease   . Medical history non-contributory   . Painful orthopaedic hardware, right ankle 12/06/2013    Patient Active Problem List   Diagnosis Date Noted  . Painful orthopaedic hardware, right ankle 12/06/2013    Past Surgical History:  Procedure Laterality Date  . HARDWARE REMOVAL Right 12/06/2013   Procedure: RIGHT ANKLE: REMOVAL RETAINED HARDWARE ;  Surgeon: Johnny Bridge, MD;  Location: [facility];  Service: Orthopedics;  Laterality: Right;  . HERNIA REPAIR  1990  . ORIF ANKLE FRACTURE  2000   right  . ORIF WRIST FRACTURE  2005   left        Home Medications    Prior to Admission medications   Medication Sig Start Date End Date Taking? Authorizing Provider  docusate sodium (COLACE) 100 MG capsule Take 1 capsule (100 mg total) by mouth every 12 (twelve) hours. 11/19/15   Ward, Delice Bison, DO  methadone (DOLOPHINE) 10 MG/ML solution Take 45 mg by mouth daily.    [provider]  polyethylene glycol (MIRALAX) packet Take 17 g by mouth 2 (two) times daily. 11/19/15   Ward, Delice Bison, DO    Family History No family history on file.  Social History Social History   Tobacco Use  . Smoking status: Former Smoker    Quit date: 12/05/1993    Years since quitting: 25.1  . Smokeless tobacco: Current User    Types: Snuff  Substance Use Topics  . Alcohol use: No  .  Drug use: No     Allergies   Patient has no known allergies.   Review of Systems Review of Systems  All other systems reviewed and are negative.    Physical Exam Updated Vital Signs BP 111/73   Pulse 74   Resp 18   Ht 5\' 4"  (1.626 m)   Wt 59 kg   SpO2 98%   BMI 22.31 kg/m   Physical Exam Vitals signs and nursing note reviewed.  Constitutional:      Appearance: Normal appearance.  HENT:     Head: Normocephalic and atraumatic.     Right Ear: External ear normal.     Left Ear: External ear normal.     Nose: Nose normal.     Mouth/Throat:     Mouth: Mucous membranes are moist.     Pharynx: Oropharynx is clear.  Eyes:     Extraocular Movements: Extraocular movements intact.     Conjunctiva/sclera: Conjunctivae normal.     Pupils: Pupils are equal, round, and reactive to light.  Neck:     Musculoskeletal: Normal range of motion and neck supple.  Cardiovascular:     Rate and Rhythm: Normal rate and regular rhythm.     Pulses: Normal pulses.  Heart sounds: Normal heart sounds.  Pulmonary:     Effort: Pulmonary effort is normal.     Breath sounds: Normal breath sounds.  Abdominal:     General: Abdomen is flat. Bowel sounds are normal.     Palpations: Abdomen is soft.  Musculoskeletal: Normal range of motion.  Skin:    General: Skin is warm.     Capillary Refill: Capillary refill takes less than 2 seconds.  Neurological:     General: No focal deficit present.     Mental Status: He is alert and oriented to person, place, and time.  Psychiatric:        Mood and Affect: Mood normal.        Behavior: Behavior normal.        Thought Content: Thought content normal.        Judgment: Judgment normal.      ED Treatments / Results  Labs (all labs ordered are listed, but only abnormal results are displayed) Labs Reviewed  COMPREHENSIVE METABOLIC PANEL - Abnormal; Notable for the following components:      Result Value   CO2 21 (*)    Glucose, Bld 230 (*)     Calcium 8.4 (*)    All other components within normal limits  ACETAMINOPHEN LEVEL - Abnormal; Notable for the following components:   Acetaminophen (Tylenol), Serum <10 (*)    All other components within normal limits  RAPID URINE DRUG SCREEN, HOSP PERFORMED - Abnormal; Notable for the following components:   Benzodiazepines POSITIVE (*)    All other components within normal limits  SALICYLATE LEVEL  ETHANOL  CBC WITH DIFFERENTIAL/PLATELET    EKG EKG Interpretation  Date/Time:  Friday January 05 2019 14:26:14 EDT Ventricular Rate:  69 PR Interval:    QRS Duration: 100 QT Interval:  449 QTC Calculation: 481 R Axis:   73 Text Interpretation:  Sinus rhythm Borderline prolonged QT interval No significant change since last tracing Confirmed by Jacalyn Lefevre [phone]) on 01/05/2019 2:37:04 PM   Radiology Dg Chest Portable 1 View  Result Date: 01/05/2019 CLINICAL DATA:  Overdose EXAM: PORTABLE CHEST 1 VIEW COMPARISON:  None. FINDINGS: The heart size and mediastinal contours are within normal limits. Both lungs are clear. The visualized skeletal structures are unremarkable. IMPRESSION: No acute abnormality of the lungs in AP portable projection. Electronically Signed   By: Lauralyn Primes M.D.   On: 01/05/2019 15:22    Procedures Procedures (including critical care time)  Medications Ordered in ED Medications  sodium chloride 0.9 % bolus 1,000 mL (0 mLs Intravenous Stopped 01/05/19 1636)  ondansetron (ZOFRAN) injection 4 mg (4 mg Intravenous Given 01/05/19 1636)     Initial Impression / Assessment and Plan / ED Course  I have reviewed the triage vital signs and the nursing notes.  Pertinent labs & imaging results that were available during my care of the patient were reviewed by me and considered in my medical decision making (see chart for details).     Pt was observed for several hours.  He is not showing any signs of resp depression.  He has remained awake and alert.  He will be  d/c home with instructions to return if worse.  Final Clinical Impressions(s) / ED Diagnoses   Final diagnoses:  Accidental drug overdose, initial encounter    ED Discharge Orders    None       Jacalyn Lefevre, MD 01/05/19 [phone]

## 2019-01-05 NOTE — ED Notes (Signed)
Spoke to patients father. Father will be here in 30 min to pick patient up.

## 2019-02-02 ENCOUNTER — Inpatient Hospital Stay (HOSPITAL_COMMUNITY)
Admission: EM | Admit: 2019-02-02 | Discharge: 2019-02-05 | DRG: 557 | Disposition: A | Discharge status: Home / Self Care | Payer: Medicaid Other | Attending: Internal Medicine | Admitting: Internal Medicine

## 2019-02-02 ENCOUNTER — Other Ambulatory Visit: Payer: Self-pay

## 2019-02-02 DIAGNOSIS — I10 Essential (primary) hypertension: Secondary | ICD-10-CM | POA: Diagnosis present

## 2019-02-02 DIAGNOSIS — IMO0002 Reserved for concepts with insufficient information to code with codable children: Secondary | ICD-10-CM | POA: Diagnosis present

## 2019-02-02 DIAGNOSIS — Z79899 Other long term (current) drug therapy: Secondary | ICD-10-CM

## 2019-02-02 DIAGNOSIS — R7401 Elevation of levels of liver transaminase levels: Secondary | ICD-10-CM | POA: Diagnosis present

## 2019-02-02 DIAGNOSIS — K7200 Acute and subacute hepatic failure without coma: Secondary | ICD-10-CM | POA: Diagnosis present

## 2019-02-02 DIAGNOSIS — R5381 Other malaise: Secondary | ICD-10-CM | POA: Diagnosis present

## 2019-02-02 DIAGNOSIS — Z79891 Long term (current) use of opiate analgesic: Secondary | ICD-10-CM

## 2019-02-02 DIAGNOSIS — Z20828 Contact with and (suspected) exposure to other viral communicable diseases: Secondary | ICD-10-CM | POA: Diagnosis present

## 2019-02-02 DIAGNOSIS — W19XXXA Unspecified fall, initial encounter: Secondary | ICD-10-CM | POA: Diagnosis not present

## 2019-02-02 DIAGNOSIS — N189 Chronic kidney disease, unspecified: Secondary | ICD-10-CM | POA: Diagnosis present

## 2019-02-02 DIAGNOSIS — M6282 Rhabdomyolysis: Principal | ICD-10-CM | POA: Diagnosis present

## 2019-02-02 DIAGNOSIS — I129 Hypertensive chronic kidney disease with stage 1 through stage 4 chronic kidney disease, or unspecified chronic kidney disease: Secondary | ICD-10-CM | POA: Diagnosis present

## 2019-02-02 DIAGNOSIS — F10959 Alcohol use, unspecified with alcohol-induced psychotic disorder, unspecified: Secondary | ICD-10-CM | POA: Diagnosis present

## 2019-02-02 DIAGNOSIS — E860 Dehydration: Secondary | ICD-10-CM | POA: Diagnosis present

## 2019-02-02 DIAGNOSIS — E160 Drug-induced hypoglycemia without coma: Secondary | ICD-10-CM | POA: Diagnosis present

## 2019-02-02 DIAGNOSIS — N179 Acute kidney failure, unspecified: Secondary | ICD-10-CM | POA: Diagnosis present

## 2019-02-02 DIAGNOSIS — K719 Toxic liver disease, unspecified: Secondary | ICD-10-CM | POA: Diagnosis present

## 2019-02-02 DIAGNOSIS — F329 Major depressive disorder, single episode, unspecified: Secondary | ICD-10-CM | POA: Diagnosis present

## 2019-02-02 DIAGNOSIS — T424X1A Poisoning by benzodiazepines, accidental (unintentional), initial encounter: Secondary | ICD-10-CM | POA: Diagnosis present

## 2019-02-02 DIAGNOSIS — T50901A Poisoning by unspecified drugs, medicaments and biological substances, accidental (unintentional), initial encounter: Secondary | ICD-10-CM

## 2019-02-02 DIAGNOSIS — X58XXXA Exposure to other specified factors, initial encounter: Secondary | ICD-10-CM | POA: Diagnosis present

## 2019-02-02 LAB — CBC WITH DIFFERENTIAL/PLATELET
Abs Immature Granulocytes: 0.05 10*3/uL (ref 0.00–0.07)
Basophils Absolute: 0 10*3/uL (ref 0.0–0.1)
Basophils Relative: 0 %
Eosinophils Absolute: 0 10*3/uL (ref 0.0–0.5)
Eosinophils Relative: 0 %
HCT: 45.9 % (ref 39.0–52.0)
Hemoglobin: 15.8 g/dL (ref 13.0–17.0)
Immature Granulocytes: 0 %
Lymphocytes Relative: 6 %
Lymphs Abs: 0.8 10*3/uL (ref 0.7–4.0)
MCH: 32.1 pg (ref 26.0–34.0)
MCHC: 34.4 g/dL (ref 30.0–36.0)
MCV: 93.3 fL (ref 80.0–100.0)
Monocytes Absolute: 0.7 10*3/uL (ref 0.1–1.0)
Monocytes Relative: 6 %
Neutro Abs: 10.6 10*3/uL — ABNORMAL HIGH (ref 1.7–7.7)
Neutrophils Relative %: 88 %
Platelets: 200 10*3/uL (ref 150–400)
RBC: 4.92 MIL/uL (ref 4.22–5.81)
RDW: 12.6 % (ref 11.5–15.5)
WBC: 12.1 10*3/uL — ABNORMAL HIGH (ref 4.0–10.5)
nRBC: 0 % (ref 0.0–0.2)

## 2019-02-02 LAB — COMPREHENSIVE METABOLIC PANEL
ALT: 225 U/L — ABNORMAL HIGH (ref 0–44)
AST: 252 U/L — ABNORMAL HIGH (ref 15–41)
Albumin: 4.5 g/dL (ref 3.5–5.0)
Alkaline Phosphatase: 67 U/L (ref 38–126)
Anion gap: 13 (ref 5–15)
BUN: 34 mg/dL — ABNORMAL HIGH (ref 6–20)
CO2: 21 mmol/L — ABNORMAL LOW (ref 22–32)
Calcium: 9.4 mg/dL (ref 8.9–10.3)
Chloride: 106 mmol/L (ref 98–111)
Creatinine, Ser: 2.59 mg/dL — ABNORMAL HIGH (ref 0.61–1.24)
GFR calc Af Amer: 31 mL/min — ABNORMAL LOW (ref >60)
GFR calc non Af Amer: 27 mL/min — ABNORMAL LOW (ref >60)
Glucose, Bld: 64 mg/dL — ABNORMAL LOW (ref 70–99)
Potassium: 5 mmol/L (ref 3.5–5.1)
Sodium: 140 mmol/L (ref 135–145)
Total Bilirubin: 0.5 mg/dL (ref 0.3–1.2)
Total Protein: 8.3 g/dL — ABNORMAL HIGH (ref 6.5–8.1)

## 2019-02-02 LAB — SALICYLATE LEVEL: Salicylate Lvl: <7 mg/dL (ref 2.8–30.0)

## 2019-02-02 LAB — RAPID URINE DRUG SCREEN, HOSP PERFORMED
Amphetamines: NOT DETECTED
Barbiturates: NOT DETECTED
Benzodiazepines: POSITIVE — AB
Cocaine: NOT DETECTED
Opiates: NOT DETECTED
Tetrahydrocannabinol: NOT DETECTED

## 2019-02-02 LAB — ACETAMINOPHEN LEVEL: Acetaminophen (Tylenol), Serum: <10 ug/mL — ABNORMAL LOW (ref 10–30)

## 2019-02-02 LAB — ETHANOL: Alcohol, Ethyl (B): <10 mg/dL (ref <10)

## 2019-02-02 MED ORDER — SODIUM CHLORIDE 0.9 % IV BOLUS
2000.0000 mL | Freq: Once | INTRAVENOUS | Status: AC
  Administered 2019-02-02: 2000 mL via INTRAVENOUS

## 2019-02-02 NOTE — ED Notes (Signed)
Pt fiance, Thornton Papas [phone], drove pt father to hospital. Pt father at bedside. I explained to him that the pt couldn't walk, wanted to leave AMA. Pt father said for him to stay here until he can walk and that "he better take that IV."

## 2019-02-02 NOTE — ED Notes (Addendum)
   02/02/19 1917  What Happened  Was fall witnessed? No  Who witnessed fall?  (No one)  Patients activity before fall other (comment) (Sitting on side of bed)  Point of contact other (comment) (Unknown)  Was patient injured? No  Patient found on floor  Found by Staff-comment  Stated prior activity other (comment) (Sitting on side of bed)  Follow Up  MD notified Belfi MD (Yes)  Time MD notified [place] notified Yes - comment  Time family notified 54 (Pt father arrived, and I relayed to him the situation)  Progress note created (see row info) Yes  Adult Fall Risk Assessment  Risk Factor Category (scoring not indicated) High fall risk per protocol (document High fall risk)  Patient Fall Risk Level High fall risk  Adult Fall Risk Interventions  Required Bundle Interventions *See Row Information* High fall risk - low, moderate, and high requirements implemented  Additional Interventions Room near nurses station;Use of appropriate toileting equipment (bedpan, BSC, etc.);Other (Comment) (EKG. routine vitals 30 mins)  Screening for Fall Injury Risk (To be completed on HIGH fall risk patients) - Assessing Need for Low Bed  Risk For Fall Injury- Low Bed Criteria None identified - Continue screening  Vitals  BP 94/81  MAP (mmHg) 86  Pulse Rate (!) 110  Oxygen Therapy  SpO2 100 %  Pain Assessment  Pain Scale Faces  Faces Pain Scale 0  Neurological  Neuro (WDL) X  Level of Consciousness Alert  Orientation Level Oriented X4  Cognition Impulsive;Poor attention/concentration;Poor judgement;Poor safety awareness  Speech Delayed responses  Musculoskeletal  Musculoskeletal (WDL) WDL  Integumentary  Integumentary (WDL) WDL

## 2019-02-02 NOTE — ED Provider Notes (Signed)
53 year old male received at signout from Dr. Fredderick Phenix pending labs and reevaluation.  Per her HPI:  "Brad [name] is [name] 53 y.o. male.   Patient is [name] 53 year old male who presents with [name] possible drug overdose.  EMS was called to his house because the family said he was acting strangely and they suspected that he took some drugs.  He reports that he took 2 Xanax 3 to 4 hours prior to EMS arrival but denies any other drug use.  He denies any alcohol use.  He was here [name] few weeks ago for similar symptoms.  He says he feels cold and shaky but otherwise denies any complaints.  He denies any suicidal ideations."  Physical Exam  BP (!) 130/91 (BP Location: Left Arm)   Pulse 91   Temp 98.6 F (37 C) (Oral)   Resp 18   SpO2 100%   Physical Exam Constitutional:      General: He is not in acute distress.    Appearance: He is well-developed. He is not ill-appearing, toxic-appearing or diaphoretic.  HENT:     Head: Normocephalic and atraumatic.  Neck:     Musculoskeletal: Neck supple.  Pulmonary:     Effort: Pulmonary effort is normal.  Neurological:     Mental Status: He is alert and oriented to person, place, and time.     Cranial Nerves: No cranial nerve deficit.     Comments: Drowsy, but arouses to loud voice.  Answers questions appropriately.  Follows simple commands and then falls back asleep.  Psychiatric:        Behavior: Behavior normal.     ED Course/Procedures     .Critical Care Performed by: Barkley Boards, PA-C Authorized by: Barkley Boards, PA-C   Critical care provider statement:    Critical care time (minutes):  40   Critical care time was exclusive of:  Teaching time and separately billable procedures and treating other patients   Critical care was necessary to treat or prevent imminent or life-threatening deterioration of the following conditions:  Renal failure   Critical care was time spent personally by me on the following activities:  Ordering and  performing treatments and interventions, ordering and review of laboratory studies, ordering and review of radiographic studies, pulse oximetry, re-evaluation of patient's condition, review of old charts, obtaining history from patient or surrogate, examination of patient and evaluation of patient's response to treatment    MDM   53 year old male received at signout from Dr. Fredderick Phenix.  He presented with abnormal behavior reports that he took several Xanax 3 to 4 hours prior to arrival.  He initially was refusing reevaluation and wanted to leave, but when his father came to pick him up he was unable to ambulate due to unsteadiness on his feet after he was found to have [name] new AKI on his labs.  His father convinced him to allow labs to be drawn and to stay for evaluation.  He remains voluntary at this time.  -22:52-Patient evaluated by me.  He is sleepy, but arouses to loud voice.  He continues to fall asleep during the conversation, but is arousable.  He reports that he has been taking Tylenol daily for the last week.  Reports he took 3 to 4 tablets of Tylenol earlier tonight, but cannot recall if he took any earlier in the day.  He cannot recall how many tablets he has been taking daily over the last week.  Discussed with the patient my concerns for  his elevated kidney function and acutely elevated transaminases.  He is agreeable to IV fluids at this time, but does not want to be admitted.  Will reassess shortly.  On reevaluation, the patient continues to be somnolent, but arousable to loud voice.  Upon re-evaluation of his labs, glucose was 64 on his metabolic panel.  Point-of-care was rechecked and his glucose was 67.  We will give him an amp of D50 and he has been encouraged to eat and drink.  Will recheck point-of-care glucose in 15 minutes.    Following amp of D50, patient is more arousable. CK level is 6784.  After reviewing the patient's medication, methadone is listed, but the patient reports that he  has not been on this medication in several years.  Uncertain of the etiology of his elevated CK at this time.  Hepatitis panel is also pending as his transaminases are acutely elevated to 252 and 225, AST and ALT, respectively.  This is an acute change from labs 1 month ago.  After further discussion, he is now agreeable to admission.  Repeat CBG is 170.  Consult to the hospitalist team and spoke with Dr. Marily Memos who is excepted the patient for admission.  Covid test was ordered, but unfortunately I was notified by nursing staff shortly after speaking with Dr. Marily Memos that the patient has refused his Covid test.  I had [name] lengthy conversation with the patient at bedside, but he is still adamantly refusing Covid test.  Updated Dr. Marily Memos who plans to speak with the patient.  Patient has not given permission to speak with his father or fianc at this time.  Spoke with the patient with Dr. Marily Memos at beside.  The patient was then agreeable to COVID-19 testing. The patient appears reasonably stabilized for admission considering the current resources, flow, and capabilities available in the ED at this time, and I doubt any other [facility] requiring further screening and/or treatment in the ED prior to admission.     Brad Maxcy A, PA-C 02/03/19 6203    Rolland Porter, MD 02/03/19 [phone]

## 2019-02-02 NOTE — ED Provider Notes (Signed)
[facility]-EMERGENCY DEPT Provider Note   CSN: [id number] Arrival date & time: 02/02/19  1831     History   Chief Complaint Chief Complaint  Patient presents with  . Drug Overdose    HPI TRAVONTA GILL is a 53 y.o. male.     Patient is a 53 year old male who presents with a possible drug overdose.  EMS was called to his house because the family said he was acting strangely and they suspected that he took some drugs.  He reports that he took 2 Xanax 3 to 4 hours prior to EMS arrival but denies any other drug use.  He denies any alcohol use.  He was here a few weeks ago for similar symptoms.  He says he feels cold and shaky but otherwise denies any complaints.  He denies any suicidal ideations.     Past Medical History:  Diagnosis Date  . Chronic kidney disease   . Medical history non-contributory   . Painful orthopaedic hardware, right ankle 12/06/2013    Patient Active Problem List   Diagnosis Date Noted  . Painful orthopaedic hardware, right ankle 12/06/2013    Past Surgical History:  Procedure Laterality Date  . HARDWARE REMOVAL Right 12/06/2013   Procedure: RIGHT ANKLE: REMOVAL RETAINED HARDWARE ;  Surgeon: Eulas Post, MD;  Location: [facility];  Service: Orthopedics;  Laterality: Right;  . HERNIA REPAIR  1990  . ORIF ANKLE FRACTURE  2000   right  . ORIF WRIST FRACTURE  2005   left        Home Medications    Prior to Admission medications   Medication Sig Start Date End Date Taking? Authorizing Provider  docusate sodium (COLACE) 100 MG capsule Take 1 capsule (100 mg total) by mouth every 12 (twelve) hours. 11/19/15   Ward, Layla Maw, DO  methadone (DOLOPHINE) 10 MG/ML solution Take 45 mg by mouth daily.    [provider]  polyethylene glycol (MIRALAX) packet Take 17 g by mouth 2 (two) times daily. 11/19/15   Ward, Layla Maw, DO    Family History No family history on file.  Social History Social  History   Tobacco Use  . Smoking status: Former Smoker    Quit date: 12/05/1993    Years since quitting: 25.1  . Smokeless tobacco: Current User    Types: Snuff  Substance Use Topics  . Alcohol use: No  . Drug use: No     Allergies   Patient has no known allergies.   Review of Systems Review of Systems  Constitutional: Positive for chills. Negative for diaphoresis, fatigue and fever.  HENT: Negative for congestion, rhinorrhea and sneezing.   Eyes: Negative.   Respiratory: Negative for cough, chest tightness and shortness of breath.   Cardiovascular: Negative for chest pain and leg swelling.  Gastrointestinal: Negative for abdominal pain, blood in stool, diarrhea, nausea and vomiting.  Genitourinary: Negative for difficulty urinating, flank pain, frequency and hematuria.  Musculoskeletal: Negative for arthralgias and back pain.  Skin: Negative for rash.  Neurological: Negative for dizziness, speech difficulty, weakness, numbness and headaches.     Physical Exam Updated Vital Signs Pulse (!) 109   Temp 97.9 F (36.6 C) (Oral)   Resp 15   SpO2 95%   Physical Exam Constitutional:      Appearance: He is well-developed.     Comments: Patient appears disheveled and is shaky  HENT:     Head: Normocephalic and atraumatic.  Eyes:  Pupils: Pupils are equal, round, and reactive to light.  Neck:     Musculoskeletal: Normal range of motion and neck supple.  Cardiovascular:     Rate and Rhythm: Normal rate and regular rhythm.     Heart sounds: Normal heart sounds.  Pulmonary:     Effort: Pulmonary effort is normal. No respiratory distress.     Breath sounds: Normal breath sounds. No wheezing or rales.  Chest:     Chest wall: No tenderness.  Abdominal:     General: Bowel sounds are normal.     Palpations: Abdomen is soft.     Tenderness: There is no abdominal tenderness. There is no guarding or rebound.  Musculoskeletal: Normal range of motion.  Lymphadenopathy:      Cervical: No cervical adenopathy.  Skin:    General: Skin is warm and dry.     Findings: No rash.  Neurological:     Mental Status: He is alert and oriented to person, place, and time.      ED Treatments / Results  Labs (all labs ordered are listed, but only abnormal results are displayed) Labs Reviewed  COMPREHENSIVE METABOLIC PANEL  CBC WITH DIFFERENTIAL/PLATELET  RAPID URINE DRUG SCREEN, HOSP PERFORMED  ETHANOL  SALICYLATE LEVEL  ACETAMINOPHEN LEVEL    EKG None  Radiology No results found.  Procedures Procedures (including critical care time)  Medications Ordered in ED Medications - No data to display   Initial Impression / Assessment and Plan / ED Course  I have reviewed the triage vital signs and the nursing notes.  Pertinent labs & imaging results that were available during my care of the patient were reviewed by me and considered in my medical decision making (see chart for details).        Patient initially was refusing any evaluation and wanted to leave.  He called his father to come pick him up.  However he was unable to ambulate due to his unsteadiness.  He was convinced to stay and eventually we were able to draw some labs.  These are still pending.  Mia McDonald PA-C to take over care.  Final Clinical Impressions(s) / ED Diagnoses   Final diagnoses:  None    ED Discharge Orders    None       Malvin Johns, MD 02/02/19 2223

## 2019-02-02 NOTE — ED Notes (Signed)
Pt refusing to change in to gown, refusing to allow this RN to apply EKG leads, refusing bloodwork.  Pt reporting "I just want to leave." MD Belfi made aware.

## 2019-02-02 NOTE — ED Notes (Signed)
During shift change, previous RN said pt refusing blood work and treatment. Talked to provider, confirmed pt requesting to leave AMA. Pt said his father was on the way to pick him up. Sat pt on side of bed to take a final set of vitals. Placed a blanket around his shoulder because he was visible cold. I sat at the computer to document the pt chart and prepare for his departure. When blood pressure reading almost complete, I heard pt slide onto floor. I did not see him fall. He was on the floor when I turned around. Assisted pt to bed. Two RNs watched him as I retrieved a wheelchair. We attempted to sit him in a wheelchair but he could not bear weight on his legs. Assisted him back to bed and covered him with heated blankets.

## 2019-02-02 NOTE — ED Triage Notes (Signed)
Pt BIBA from home.    Per EMS-  Family called d/t pt acting strangely- Pt told Sheriffs that he took 2 xanax 3-4 hr PTA, pt uncooperative with questioning.   Pt received 4 mg Zofran IM d/t emesis on scene.  Received 4 mg intranasal narcan, but was ineffective. Pt had bouts of somnolence en route to ED, but became more alert on arrival.   VS   124/90 HR 90 R 12 99% RA CBG 95

## 2019-02-03 ENCOUNTER — Other Ambulatory Visit: Payer: Self-pay

## 2019-02-03 ENCOUNTER — Encounter (HOSPITAL_COMMUNITY): Payer: Self-pay | Admitting: *Deleted

## 2019-02-03 DIAGNOSIS — F10959 Alcohol use, unspecified with alcohol-induced psychotic disorder, unspecified: Secondary | ICD-10-CM

## 2019-02-03 DIAGNOSIS — N179 Acute kidney failure, unspecified: Secondary | ICD-10-CM | POA: Diagnosis present

## 2019-02-03 DIAGNOSIS — F1310 Sedative, hypnotic or anxiolytic abuse, uncomplicated: Secondary | ICD-10-CM

## 2019-02-03 DIAGNOSIS — IMO0002 Reserved for concepts with insufficient information to code with codable children: Secondary | ICD-10-CM | POA: Diagnosis present

## 2019-02-03 DIAGNOSIS — M6282 Rhabdomyolysis: Principal | ICD-10-CM

## 2019-02-03 DIAGNOSIS — R7401 Elevation of levels of liver transaminase levels: Secondary | ICD-10-CM | POA: Diagnosis present

## 2019-02-03 LAB — CBC
HCT: 38.8 % — ABNORMAL LOW (ref 39.0–52.0)
Hemoglobin: 13.1 g/dL (ref 13.0–17.0)
MCH: 31.8 pg (ref 26.0–34.0)
MCHC: 33.8 g/dL (ref 30.0–36.0)
MCV: 94.2 fL (ref 80.0–100.0)
Platelets: 149 10*3/uL — ABNORMAL LOW (ref 150–400)
RBC: 4.12 MIL/uL — ABNORMAL LOW (ref 4.22–5.81)
RDW: 12.9 % (ref 11.5–15.5)
WBC: 9.8 10*3/uL (ref 4.0–10.5)
nRBC: 0 % (ref 0.0–0.2)

## 2019-02-03 LAB — URINALYSIS, ROUTINE W REFLEX MICROSCOPIC
Bacteria, UA: NONE SEEN
Bilirubin Urine: NEGATIVE
Glucose, UA: >=500 mg/dL — AB
Ketones, ur: 5 mg/dL — AB
Leukocytes,Ua: NEGATIVE
Nitrite: NEGATIVE
Protein, ur: 100 mg/dL — AB
Specific Gravity, Urine: 1.014 (ref 1.005–1.030)
pH: 5 (ref 5.0–8.0)

## 2019-02-03 LAB — CK
Total CK: 6784 U/L — ABNORMAL HIGH (ref 49–397)
Total CK: 8801 U/L — ABNORMAL HIGH (ref 49–397)

## 2019-02-03 LAB — COMPREHENSIVE METABOLIC PANEL
ALT: 161 U/L — ABNORMAL HIGH (ref 0–44)
AST: 187 U/L — ABNORMAL HIGH (ref 15–41)
Albumin: 3.3 g/dL — ABNORMAL LOW (ref 3.5–5.0)
Alkaline Phosphatase: 53 U/L (ref 38–126)
Anion gap: 8 (ref 5–15)
BUN: 34 mg/dL — ABNORMAL HIGH (ref 6–20)
CO2: 20 mmol/L — ABNORMAL LOW (ref 22–32)
Calcium: 8.2 mg/dL — ABNORMAL LOW (ref 8.9–10.3)
Chloride: 110 mmol/L (ref 98–111)
Creatinine, Ser: 2.01 mg/dL — ABNORMAL HIGH (ref 0.61–1.24)
GFR calc Af Amer: 42 mL/min — ABNORMAL LOW (ref >60)
GFR calc non Af Amer: 36 mL/min — ABNORMAL LOW (ref >60)
Glucose, Bld: 148 mg/dL — ABNORMAL HIGH (ref 70–99)
Potassium: 4.5 mmol/L (ref 3.5–5.1)
Sodium: 138 mmol/L (ref 135–145)
Total Bilirubin: 0.8 mg/dL (ref 0.3–1.2)
Total Protein: 6.3 g/dL — ABNORMAL LOW (ref 6.5–8.1)

## 2019-02-03 LAB — HEPATITIS PANEL, ACUTE
HCV Ab: REACTIVE — AB
Hep A IgM: NONREACTIVE
Hep B C IgM: NONREACTIVE
Hepatitis B Surface Ag: NONREACTIVE

## 2019-02-03 LAB — SARS CORONAVIRUS 2 BY RT PCR (HOSPITAL ORDER, PERFORMED IN ~~LOC~~ HOSPITAL LAB): SARS Coronavirus 2: NEGATIVE

## 2019-02-03 LAB — HIV ANTIBODY (ROUTINE TESTING W REFLEX): HIV Screen 4th Generation wRfx: NONREACTIVE

## 2019-02-03 LAB — ACETAMINOPHEN LEVEL: Acetaminophen (Tylenol), Serum: <10 ug/mL — ABNORMAL LOW (ref 10–30)

## 2019-02-03 LAB — CBG MONITORING, ED
Glucose-Capillary: 170 mg/dL — ABNORMAL HIGH (ref 70–99)
Glucose-Capillary: 67 mg/dL — ABNORMAL LOW (ref 70–99)

## 2019-02-03 MED ORDER — SODIUM CHLORIDE 0.9 % IV SOLN
INTRAVENOUS | Status: DC
  Administered 2019-02-03 – 2019-02-05 (×7): via INTRAVENOUS

## 2019-02-03 MED ORDER — ONDANSETRON HCL 4 MG/2ML IJ SOLN: 4.0000 mg | Freq: Four times a day (QID) | INTRAMUSCULAR | Status: DC | PRN

## 2019-02-03 MED ORDER — DEXTROSE 50 % IV SOLN
1.0000 | Freq: Once | INTRAVENOUS | Status: AC
  Administered 2019-02-03: 01:00:00 50 mL via INTRAVENOUS
  Filled 2019-02-03: qty 50

## 2019-02-03 MED ORDER — HEPARIN SODIUM (PORCINE) 5000 UNIT/ML IJ SOLN
5000.0000 [IU] | Freq: Three times a day (TID) | INTRAMUSCULAR | Status: DC
  Administered 2019-02-03 – 2019-02-05 (×7): 5000 [IU] via SUBCUTANEOUS
  Filled 2019-02-03 (×7): qty 1

## 2019-02-03 MED ORDER — ONDANSETRON HCL 4 MG PO TABS: 4.0000 mg | ORAL_TABLET | Freq: Four times a day (QID) | ORAL | Status: DC | PRN

## 2019-02-03 NOTE — ED Notes (Signed)
No orange juice in ED. Gave pt 8oz cran-grape juice.

## 2019-02-03 NOTE — Progress Notes (Signed)
  Pt tx from the Ed, per staff the pt became increasly agitated  because he wanted to leave AMA shortly  after he sustained an unwitnessed fall. Teley sitter ordered for fall and flight risk, pt alert with flat affect, orange sign and yellow socks, floor matts placed. Skin dry and intact. I will continue to monitor.

## 2019-02-03 NOTE — H&P (Signed)
History and Physical    Brad [name] OQH:[id number] DOB: March 15, 1966 DOA: 02/02/2019  PCP: Hoyt Koch, MD (Inactive) Patient coming from: home / EMS  Chief Complaint: AMS  HPI: Brad [name] is a 53 y.o. male with medical history significant of CKD and possible drug use. Unable to obtain full history due to pts AMS. Per EDP report - Pts family called sheriffs dept because pt was acting odd after taking 2 xanax. Narcan given by EMS w/o improvement.  Pt unable to give reliable history and would simply repeat whatever I would say/ask. See prior provider notes for more history.   ED Course: Lengthy ED workup in part due to pts intermittent refusal of care and testing   Review of Systems: As per HPI otherwise all other systems reviewed and are negative  Ambulatory Status: unclear  Past Medical History:  Diagnosis Date  . Chronic kidney disease   . Medical history non-contributory   . Painful orthopaedic hardware, right ankle 12/06/2013    Past Surgical History:  Procedure Laterality Date  . HARDWARE REMOVAL Right 12/06/2013   Procedure: RIGHT ANKLE: REMOVAL RETAINED HARDWARE ;  Surgeon: Eulas Post, MD;  Location: [facility];  Service: Orthopedics;  Laterality: Right;  . HERNIA REPAIR  1990  . ORIF ANKLE FRACTURE  2000   right  . ORIF WRIST FRACTURE  2005   left    Social History   Socioeconomic History  . Marital status: Single    Spouse name: Not on file  . Number of children: Not on file  . Years of education: Not on file  . Highest education level: Not on file  Occupational History  . Not on file  Social Needs  . Financial resource strain: Not on file  . Food insecurity    Worry: Not on file    Inability: Not on file  . Transportation needs    Medical: Not on file    Non-medical: Not on file  Tobacco Use  . Smoking status: Former Smoker    Quit date: 12/05/1993    Years since quitting: 25.1  . Smokeless tobacco: Current User   Types: Snuff  Substance and Sexual Activity  . Alcohol use: No  . Drug use: No  . Sexual activity: Not on file  Lifestyle  . Physical activity    Days per week: Not on file    Minutes per session: Not on file  . Stress: Not on file  Relationships  . Social Musician on phone: Not on file    Gets together: Not on file    Attends religious service: Not on file    Active member of club or organization: Not on file    Attends meetings of clubs or organizations: Not on file    Relationship status: Not on file  . Intimate partner violence    Fear of current or ex partner: Not on file    Emotionally abused: Not on file    Physically abused: Not on file    Forced sexual activity: Not on file  Other Topics Concern  . Not on file  Social History Narrative  . Not on file    No Known Allergies  No family history on file.  Unable to obtain from pt or in attempts to call family  Prior to Admission medications   Medication Sig Start Date End Date Taking? Authorizing Provider  docusate sodium (COLACE) 100 MG capsule Take 1 capsule (100 mg  total) by mouth every 12 (twelve) hours. 11/19/15   Ward, Delice Bison, DO  methadone (DOLOPHINE) 10 MG/ML solution Take 45 mg by mouth daily.    [provider]  polyethylene glycol (MIRALAX) packet Take 17 g by mouth 2 (two) times daily. 11/19/15   Ward, Delice Bison, DO    Physical Exam: Vitals:   02/03/19 0130 02/03/19 0200 02/03/19 0230 02/03/19 0259  BP: 106/76 93/75 102/72 (!) 130/91  Pulse:  85 86 91  Resp: 11 (!) 9 11 18   Temp:    98.6 F (37 C)  TempSrc:    Oral  SpO2:  97% 95% 100%     General:  Appears calm and comfortable. Arousable w/ loud voice and touch Eyes:  PERRL, EOMI, normal lids, iris ENT:  grossly normal hearing, lips & tongue, dry mucus membranes Neck:  no LAD, masses or thyromegaly Cardiovascular:  RRR, no m/r/g. No LE edema.  Respiratory:  CTA bilaterally, no w/r/r. Normal respiratory effort. Abdomen:   soft, ntnd, NABS Skin:  no rash or induration seen on limited exam Musculoskeletal:  grossly normal tone BUE/BLE, good ROM, no bony abnormality Psychiatric: Sleepy. Irritable. Unable to provide more than a few words at a time in a sentence before falling back to sleep.  Neurologic:  CN 2-12 grossly intact, moves all extremities in coordinated fashion, sensation intact  Labs on Admission: I have personally reviewed following labs and imaging studies  CBC: Recent Labs  Lab 02/02/19 2116  WBC 12.1*  NEUTROABS 10.6*  HGB 15.8  HCT 45.9  MCV 93.3  PLT 034   Basic Metabolic Panel: Recent Labs  Lab 02/02/19 2116  NA 140  K 5.0  CL 106  CO2 21*  GLUCOSE 64*  BUN 34*  CREATININE 2.59*  CALCIUM 9.4   GFR: CrCl cannot be calculated (Unknown ideal weight.). Liver Function Tests: Recent Labs  Lab 02/02/19 2116  AST 252*  ALT 225*  ALKPHOS 67  BILITOT 0.5  PROT 8.3*  ALBUMIN 4.5   No results for input(s): LIPASE, AMYLASE in the last 168 hours. No results for input(s): AMMONIA in the last 168 hours. Coagulation Profile: No results for input(s): INR, PROTIME in the last 168 hours. Cardiac Enzymes: Recent Labs  Lab 02/02/19 2116  CKTOTAL 6,784*   BNP (last 3 results) No results for input(s): PROBNP in the last 8760 hours. HbA1C: No results for input(s): HGBA1C in the last 72 hours. CBG: Recent Labs  Lab 02/03/19 0111 02/03/19 0126  GLUCAP 67* 170*   Lipid Profile: No results for input(s): CHOL, HDL, LDLCALC, TRIG, CHOLHDL, LDLDIRECT in the last 72 hours. Thyroid Function Tests: No results for input(s): TSH, T4TOTAL, FREET4, T3FREE, THYROIDAB in the last 72 hours. Anemia Panel: No results for input(s): VITAMINB12, FOLATE, FERRITIN, TIBC, IRON, RETICCTPCT in the last 72 hours. Urine analysis:    Component Value Date/Time   COLORURINE YELLOW 02/02/2019 2215   APPEARANCEUR HAZY (A) 02/02/2019 2215   LABSPEC 1.014 02/02/2019 2215   PHURINE 5.0 02/02/2019 2215    GLUCOSEU >=500 (A) 02/02/2019 2215   HGBUR LARGE (A) 02/02/2019 2215   BILIRUBINUR NEGATIVE 02/02/2019 2215   BILIRUBINUR negative 01/25/2015 1116   KETONESUR 5 (A) 02/02/2019 2215   PROTEINUR 100 (A) 02/02/2019 2215   UROBILINOGEN 0.2 06/08/2015 1350   NITRITE NEGATIVE 02/02/2019 2215   LEUKOCYTESUR NEGATIVE 02/02/2019 2215    Creatinine Clearance: CrCl cannot be calculated (Unknown ideal weight.).  Sepsis Labs: @LABRCNTIP (procalcitonin:4,lacticidven:4) )No results found for this or any previous  visit (from the past 240 hour(s)).   Radiological Exams on Admission: No results found.  EKG: Independently reviewed. Sinus, tachy, no ACS  Assessment/Plan Active Problems:   Rhabdomyolysis   Altered mental status associated with intoxication (HCC)   AKI (acute kidney injury) (HCC)   Transaminitis   Rhabdomyolysis: CK 6,784 on admission. Unknown etiology. Unable to obtain reliable history. Suspect drug related etiology.  - Brisk IVF - am CK, BMP - obtain further history when pt more awake  AMS: Multifactorial hypoglycemia and  from benzodiazepines and other recreational drugs that do not show up on standard tox screen. Pt w/ negative tox screen in past w/ marked improvement in cognitive status w/ Narcan. Narcan given by EMS w/o improvement. Glucose at time of EMS evaluation 95 but dropped to 65 in ED. EDP reports improvement in cognitive function after D5.  - CBG Q4 - IVF - Tele, Pulse Ox  Transaminase Elevation: ETOH use vs Drug use vs Tylenol use. Pt states he took 3-4 tylenol a few hours prior to coming. Denies SI/HI. Denies taking more than the stated 3-4. Initialy Tylenol and ASA levels nml.  - Repeat STAT Tylenol level - CMP in am  AKI: Cr 2.59. Likely from Rhabdo and dehydration.  - IVF  - CMP in am  Social: Unclear of pt living situation. Pt father convinced pt to stay and not come home. Unable to reach Father at time of admission. Pt very antagonistic to ED staff  and nearly left on several occasions. Pt unwilling to participate in admission examination process.  - Attempt to contact father again in am for more information - may need SW consult.  - Pt high risk for leaving AMA   DVT prophylaxis: Hep  Code Status: FULL  Family Communication: Unable to reach father at time of admission - [phone]  Disposition Plan: pending improvement in renal function, mental status, and rhabdo  Consults called: none  Admission status: observation    Ozella Rocksavid J Edynn Gillock MD [facility]  If 7PM-7AM, please contact night-coverage [email or web address] Password [email or web address]  02/03/2019, 3:38 AM

## 2019-02-03 NOTE — Progress Notes (Addendum)
PROGRESS NOTE    Brad [name]  ZOX:[id number] DOB: 01/11/1966 DOA: 02/02/2019 PCP: Hoyt KochYousef, Deema, MD (Inactive)    Brief Narrative:   Brad [name] is a 53 y.o. male with medical history significant of CKD and possible drug use. Unable to obtain full history due to pts AMS. Per EDP report - Pts family called sheriffs dept because pt was acting odd after taking 2 xanax. Narcan given by EMS w/o improvement.  Pt unable to give reliable history and would simply repeat whatever I would say/ask.  ED Course: Lengthy ED workup in part due to pts intermittent refusal of care and testing   Assessment & Plan:   Active Problems:   Rhabdomyolysis   Altered mental status associated with intoxication (HCC)   AKI (acute kidney injury) (HCC)   Transaminitis   Rhabdomyolysis Patient presenting to ED via EMS after family called Sheriff's department because patient was acting strangely.  On presentation patient was noted to have an elevated CK level of 6784.  Patient unable to explain events leading up to hospitalization.  No reports that patient was found down for prolonged period time, no apparent trauma.  Suspect possibly drug-induced.  Patient received 2 L normal saline bolus in the ED. --CK [id number]-->8801 --increase NS from 150 to 200 mL/h --Repeat CK level in the a.m. --PT evaluation --Supportive care  Acute renal failure Creatinine 2.59 on presentation, last baseline 1.10 01/05/2019.  Etiology likely pigment induced from underlying rhabdomyolysis with elevated myoglobin on urinalysis. --Cr 2.59-->2.01 --Continue aggressive IV fluid hydration with NS at 150 mL/h --Avoid nephrotoxins, renally dose all medications --Repeat renal function in a.m.  Transaminitis Patient presenting with AST/ALT elevation; 252 and 225 respectively.  UDS positive for benzos.  EtOH level less than 10 and salicylate level less than 7.0.  Suspect a drug-induced liver injury versus shock liver from  possible hypotension prior to presentation. --LFTs improving --Continue IV fluid hydration as above --Continue to trend LFTs  Substance abuse Patient denies any history of drug abuse.  UDS positive for benzos, denies that he takes any prescribed medication.  Patient counseled.  DVT prophylaxis: Heparin Code Status: Full code Family Communication: None present at bedside Disposition Plan: Continue inpatient, IV fluids for acute renal failure/rhabdomyolysis, further dependent on clinical course.  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   Consultants:   None  Procedures:   None  Antimicrobials:   None   Subjective: Patient seen and examined at bedside, sleeping but arousable.  He knows that he is in the hospital, believes [facility] is [facility].  Otherwise is oriented to person and time.  Does not seem to want to engage in much conversation this morning.  No specific complaints.  Denies headache, no chest pain, no palpitations, no shortness of breath, no abdominal pain, no dizziness, no myalgias.  Apparently, per nursing notes patient sustained an unwitnessed fall as he was being transferred from the ED to the floor and requesting to leave AMA.  No other complaints or concerns overnight per nursing staff.  Objective: Vitals:   02/03/19 0200 02/03/19 0230 02/03/19 0259 02/03/19 0408  BP: 93/75 102/72 (!) 130/91   Pulse: 85 86 91   Resp: (!) 9 11 18    Temp:   98.6 F (37 C)   TempSrc:   Oral   SpO2: 97% 95% 100%   Weight:    59 kg  Height:    5\' 4"  (1.626 m)    Intake/Output  Summary (Last 24 hours) at 02/03/2019 1220 Last data filed at 02/03/2019 0914 Gross per 24 hour  Intake 1990 ml  Output 350 ml  Net 1640 ml   Filed Weights   02/03/19 0408  Weight: 59 kg    Examination:  General exam: Appears calm and comfortable; appears older than stated age Respiratory system: Clear to auscultation. Respiratory effort normal. Cardiovascular  system: S1 & S2 heard, RRR. No JVD, murmurs, rubs, gallops or clicks. No pedal edema. Gastrointestinal system: Abdomen is nondistended, soft and nontender. No organomegaly or masses felt. Normal bowel sounds heard. Central nervous system: Alert and oriented. No focal neurological deficits. Extremities: Symmetric 5 x 5 power. Skin: No rashes, lesions or ulcers Psychiatry: Judgement and insight appear poor.  Depressed mood, flat affect, slightly withdrawn    Data Reviewed: I have personally reviewed following labs and imaging studies  CBC: Recent Labs  Lab 02/02/19 2116 02/03/19 0420  WBC 12.1* 9.8  NEUTROABS 10.6*  --   HGB 15.8 13.1  HCT 45.9 38.8*  MCV 93.3 94.2  PLT 200 694*   Basic Metabolic Panel: Recent Labs  Lab 02/02/19 2116 02/03/19 0420  NA 140 138  K 5.0 4.5  CL 106 110  CO2 21* 20*  GLUCOSE 64* 148*  BUN 34* 34*  CREATININE 2.59* 2.01*  CALCIUM 9.4 8.2*   GFR: Estimated Creatinine Clearance: 34.7 mL/min (A) (by C-G formula based on SCr of 2.01 mg/dL (H)). Liver Function Tests: Recent Labs  Lab 02/02/19 2116 02/03/19 0420  AST 252* 187*  ALT 225* 161*  ALKPHOS 67 53  BILITOT 0.5 0.8  PROT 8.3* 6.3*  ALBUMIN 4.5 3.3*   No results for input(s): LIPASE, AMYLASE in the last 168 hours. No results for input(s): AMMONIA in the last 168 hours. Coagulation Profile: No results for input(s): INR, PROTIME in the last 168 hours. Cardiac Enzymes: Recent Labs  Lab 02/02/19 2116 02/03/19 0420  CKTOTAL 6,784* 8,801*   BNP (last 3 results) No results for input(s): PROBNP in the last 8760 hours. HbA1C: No results for input(s): HGBA1C in the last 72 hours. CBG: Recent Labs  Lab 02/03/19 0111 02/03/19 0126  GLUCAP 67* 170*   Lipid Profile: No results for input(s): CHOL, HDL, LDLCALC, TRIG, CHOLHDL, LDLDIRECT in the last 72 hours. Thyroid Function Tests: No results for input(s): TSH, T4TOTAL, FREET4, T3FREE, THYROIDAB in the last 72 hours. Anemia  Panel: No results for input(s): VITAMINB12, FOLATE, FERRITIN, TIBC, IRON, RETICCTPCT in the last 72 hours. Sepsis Labs: No results for input(s): PROCALCITON, LATICACIDVEN in the last 168 hours.  Recent Results (from the past 240 hour(s))  SARS Coronavirus 2 by RT PCR (hospital order, performed in [facility] hospital lab) Nasopharyngeal Nasopharyngeal Swab     Status: None   Collection Time: 02/03/19  1:17 AM   Specimen: Nasopharyngeal Swab  Result Value Ref Range Status   SARS Coronavirus 2 NEGATIVE NEGATIVE Final    Comment: (NOTE) If result is NEGATIVE SARS-CoV-2 target nucleic acids are NOT DETECTED. The SARS-CoV-2 RNA is generally detectable in upper and lower  respiratory specimens during the acute phase of infection. The lowest  concentration of SARS-CoV-2 viral copies this assay can detect is 250  copies / mL. A negative result does not preclude SARS-CoV-2 infection  and should not be used as the sole basis for treatment or other  patient management decisions.  A negative result may occur with  improper specimen collection / handling, submission of specimen other  than nasopharyngeal swab,  presence of viral mutation(s) within the  areas targeted by this assay, and inadequate number of viral copies  (<250 copies / mL). A negative result must be combined with clinical  observations, patient history, and epidemiological information. If result is POSITIVE SARS-CoV-2 target nucleic acids are DETECTED. The SARS-CoV-2 RNA is generally detectable in upper and lower  respiratory specimens dur ing the acute phase of infection.  Positive  results are indicative of active infection with SARS-CoV-2.  Clinical  correlation with patient history and other diagnostic information is  necessary to determine patient infection status.  Positive results do  not rule out bacterial infection or co-infection with other viruses. If result is PRESUMPTIVE POSTIVE SARS-CoV-2 nucleic acids MAY BE  PRESENT.   A presumptive positive result was obtained on the submitted specimen  and confirmed on repeat testing.  While 2019 novel coronavirus  (SARS-CoV-2) nucleic acids may be present in the submitted sample  additional confirmatory testing may be necessary for epidemiological  and / or clinical management purposes  to differentiate between  SARS-CoV-2 and other Sarbecovirus currently known to infect humans.  If clinically indicated additional testing with an alternate test  methodology [phone]) is advised. The SARS-CoV-2 RNA is generally  detectable in upper and lower respiratory sp ecimens during the acute  phase of infection. The expected result is Negative. Fact Sheet for Patients:  BoilerBrush.com.cy Fact Sheet for Healthcare Providers: https://pope.com/ This test is not yet approved or cleared by the [place] FDA and has been authorized for detection and/or diagnosis of SARS-CoV-2 by FDA under an Emergency Use Authorization (EUA).  This EUA will remain in effect (meaning this test can be used) for the duration of the COVID-19 declaration under Section 564(b)(1) of the Act, 21 U.S.C. section 360bbb-3(b)(1), unless the authorization is terminated or revoked sooner. Performed at [facility], [address] [place], [place] [address]          Radiology Studies: No results found.      Scheduled Meds:  heparin  5,000 Units Subcutaneous Q8H   Continuous Infusions:  sodium chloride 150 mL/hr at 02/03/19 1032     LOS: 0 days    Time spent: 37 minutes spent on chart review, discussion with nursing staff, consultants, updating family and interview/physical exam; more than 50% of that time was spent in counseling and/or coordination of care.    Alvira Philips [place], DO [facility] 02/03/2019, 12:20 PM

## 2019-02-03 NOTE — Progress Notes (Signed)
Called by pt primary RN, Venetia Night, regarding medications the pt's girlfriend found medications in the bedside drawer.  Medications were unlabeled in a ziplock bag.  There were 7 "intact pills" and 2 small blue packets of a powdered substance (which pt states are an antibiotic). Medications were taken to pharmacy for identification and to be secured. Stacey Drain

## 2019-02-04 DIAGNOSIS — R296 Repeated falls: Secondary | ICD-10-CM

## 2019-02-04 DIAGNOSIS — R531 Weakness: Secondary | ICD-10-CM

## 2019-02-04 LAB — COMPREHENSIVE METABOLIC PANEL
ALT: 108 U/L — ABNORMAL HIGH (ref 0–44)
AST: 129 U/L — ABNORMAL HIGH (ref 15–41)
Albumin: 2.9 g/dL — ABNORMAL LOW (ref 3.5–5.0)
Alkaline Phosphatase: 42 U/L (ref 38–126)
Anion gap: 7 (ref 5–15)
BUN: 16 mg/dL (ref 6–20)
CO2: 18 mmol/L — ABNORMAL LOW (ref 22–32)
Calcium: 8 mg/dL — ABNORMAL LOW (ref 8.9–10.3)
Chloride: 115 mmol/L — ABNORMAL HIGH (ref 98–111)
Creatinine, Ser: 0.92 mg/dL (ref 0.61–1.24)
GFR calc Af Amer: >60 mL/min (ref >60)
GFR calc non Af Amer: >60 mL/min (ref >60)
Glucose, Bld: 90 mg/dL (ref 70–99)
Potassium: 3.9 mmol/L (ref 3.5–5.1)
Sodium: 140 mmol/L (ref 135–145)
Total Bilirubin: 0.4 mg/dL (ref 0.3–1.2)
Total Protein: 5.5 g/dL — ABNORMAL LOW (ref 6.5–8.1)

## 2019-02-04 LAB — CK: Total CK: 4885 U/L — ABNORMAL HIGH (ref 49–397)

## 2019-02-04 NOTE — Progress Notes (Signed)
PROGRESS NOTE    Brad [name]  [id number] DOB: 1965-07-09 DOA: 02/02/2019 PCP: Hoyt KochYousef, Deema, MD (Inactive)    Brief Narrative:   Brad [name] is a 53 y.o. male with medical history significant of CKD and possible drug use. Unable to obtain full history due to pts AMS. Per EDP report - Pts family called sheriffs dept because pt was acting odd after taking 2 xanax. Narcan given by EMS w/o improvement.  Pt unable to give reliable history and would simply repeat whatever I would say/ask.  ED Course: Lengthy ED workup in part due to pts intermittent refusal of care and testing   Assessment & Plan:   Active Problems:   Rhabdomyolysis   Altered mental status associated with intoxication (HCC)   AKI (acute kidney injury) (HCC)   Transaminitis   Rhabdomyolysis Patient presenting to ED via EMS after family called Sheriff's department because patient was acting strangely.  On presentation patient was noted to have an elevated CK level of 6784.  Patient unable to explain events leading up to hospitalization.  No reports that patient was found down for prolonged period time, no apparent trauma.  Suspect possibly drug-induced.  Patient received 2 L normal saline bolus in the ED. --CK 6784-->8801-->4885 --Continue normal saline at 200 mL/h --Repeat CK level in the a.m. --PT evaluation pending --Supportive care  Acute renal failure Creatinine 2.59 on presentation, last baseline 1.10 01/05/2019.  Etiology likely pigment induced from underlying rhabdomyolysis with elevated myoglobin on urinalysis. --Cr 2.59-->2.01 --Continue aggressive IV fluid hydration with NS at 150 mL/h --Avoid nephrotoxins, renally dose all medications --Repeat renal function in a.m.  Transaminitis Patient presenting with AST/ALT elevation; 252 and 225 respectively.  UDS positive for benzos.  EtOH level less than 10 and salicylate level less than 7.0.  Suspect a drug-induced liver injury versus shock  liver from possible hypotension prior to presentation. --LFTs improving --Continue IV fluid hydration as above --Continue to trend LFTs  [facility] Center[facility] Weakness/debility Patient with several falls during the hospitalization, unwitnessed. --Continue telesitter, fall precautions, bed alarm --Awaiting PT evaluation  Substance abuse Patient denies any history of drug abuse.  UDS positive for benzos, denies that he takes any prescribed medication.  Patient counseled.  DVT prophylaxis: Heparin Code Status: Full code Family Communication: Updated patient's father by telephone this morning. Disposition Plan: Continue inpatient, IV fluids for acute renal failure/rhabdomyolysis, further dependent on clinical course.  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  Awaiting PT evaluation as a unsafe discharge secondary to multiple falls during hospitalization.   Consultants:   None  Procedures:   None  Antimicrobials:   None   Subjective: Patient seen and examined at bedside, resting comfortably.  Continues to be unable to describe events leading to hospitalization.  Denies any illicit drug use.  Overnight, patient with 2 unwitnessed falls, one in the bathroom.  He states his legs feel fatigued while standing leading to collapse.  Discussed with patient's father, apparently patient's fianc found several unidentifiable "pills and powder" in patient's bedside nightstand, and patient's father believes that he abuses illicit drugs, but unable to identify.  Patient asking about timing of discharge, discussed with him that since he is very weak, unsafe at this time with several falls overnight.  No other complaints or concerns at this time.  Denies headache, no chest pain, no palpitations, no shortness of breath, no abdominal pain, no dizziness, no myalgias.  Other than falls overnight, no acute concerns/issues per nursing  staff.  Objective: Vitals:   02/03/19 0408 02/03/19  1502 02/03/19 2216 02/04/19 0550  BP:  (!) 126/94 (!) 140/93 (!) 156/94  Pulse:  75 65 72  Resp:  18 16 16   Temp:  98.2 F (36.8 C) 98.8 F (37.1 C) 99.9 F (37.7 C)  TempSrc:  Oral Oral Oral  SpO2:  97% 98% 99%  Weight: 59 kg     Height: 5\' 4"  (1.626 m)       Intake/Output Summary (Last 24 hours) at 02/04/2019 1220 Last data filed at 02/04/2019 0830 Gross per 24 hour  Intake 4571.28 ml  Output 1200 ml  Net 3371.28 ml   Filed Weights   02/03/19 0408  Weight: 59 kg    Examination:  General exam: Appears calm and comfortable; no acute distress Respiratory system: Clear to auscultation. Respiratory effort normal. Cardiovascular system: S1 & S2 heard, RRR. No JVD, murmurs, rubs, gallops or clicks. No pedal edema. Gastrointestinal system: Abdomen is nondistended, soft and nontender. No organomegaly or masses felt. Normal bowel sounds heard. Central nervous system: Alert and oriented. No focal neurological deficits. Extremities: Symmetric 5 x 5 power. Skin: No rashes, lesions or ulcers Psychiatry: Judgement and insight appear poor.  Depressed mood, flat affect, slightly withdrawn    Data Reviewed: I have personally reviewed following labs and imaging studies  CBC: Recent Labs  Lab 02/02/19 2116 02/03/19 0420  WBC 12.1* 9.8  NEUTROABS 10.6*  --   HGB 15.8 13.1  HCT 45.9 38.8*  MCV 93.3 94.2  PLT 200 650*   Basic Metabolic Panel: Recent Labs  Lab 02/02/19 2116 02/03/19 0420 02/04/19 0414  NA 140 138 140  K 5.0 4.5 3.9  CL 106 110 115*  CO2 21* 20* 18*  GLUCOSE 64* 148* 90  BUN 34* 34* 16  CREATININE 2.59* 2.01* 0.92  CALCIUM 9.4 8.2* 8.0*   GFR: Estimated Creatinine Clearance: 75.7 mL/min (by C-G formula based on SCr of 0.92 mg/dL). Liver Function Tests: Recent Labs  Lab 02/02/19 2116 02/03/19 0420 02/04/19 0414  AST 252* 187* 129*  ALT 225* 161* 108*  ALKPHOS 67 53 42  BILITOT 0.5 0.8 0.4  PROT 8.3* 6.3* 5.5*  ALBUMIN 4.5 3.3* 2.9*   No  results for input(s): LIPASE, AMYLASE in the last 168 hours. No results for input(s): AMMONIA in the last 168 hours. Coagulation Profile: No results for input(s): INR, PROTIME in the last 168 hours. Cardiac Enzymes: Recent Labs  Lab 02/02/19 2116 02/03/19 0420 02/04/19 0414  CKTOTAL 6,784* 8,801* 4,885*   BNP (last 3 results) No results for input(s): PROBNP in the last 8760 hours. HbA1C: No results for input(s): HGBA1C in the last 72 hours. CBG: Recent Labs  Lab 02/03/19 0111 02/03/19 0126  GLUCAP 67* 170*   Lipid Profile: No results for input(s): CHOL, HDL, LDLCALC, TRIG, CHOLHDL, LDLDIRECT in the last 72 hours. Thyroid Function Tests: No results for input(s): TSH, T4TOTAL, FREET4, T3FREE, THYROIDAB in the last 72 hours. Anemia Panel: No results for input(s): VITAMINB12, FOLATE, FERRITIN, TIBC, IRON, RETICCTPCT in the last 72 hours. Sepsis Labs: No results for input(s): PROCALCITON, LATICACIDVEN in the last 168 hours.  Recent Results (from the past 240 hour(s))  SARS Coronavirus 2 by RT PCR (hospital order, performed in [facility] hospital lab) Nasopharyngeal Nasopharyngeal Swab     Status: None   Collection Time: 02/03/19  1:17 AM   Specimen: Nasopharyngeal Swab  Result Value Ref Range Status   SARS Coronavirus 2 NEGATIVE NEGATIVE Final  Comment: (NOTE) If result is NEGATIVE SARS-CoV-2 target nucleic acids are NOT DETECTED. The SARS-CoV-2 RNA is generally detectable in upper and lower  respiratory specimens during the acute phase of infection. The lowest  concentration of SARS-CoV-2 viral copies this assay can detect is 250  copies / mL. A negative result does not preclude SARS-CoV-2 infection  and should not be used as the sole basis for treatment or other  patient management decisions.  A negative result may occur with  improper specimen collection / handling, submission of specimen other  than nasopharyngeal swab, presence of viral mutation(s) within the   areas targeted by this assay, and inadequate number of viral copies  (<250 copies / mL). A negative result must be combined with clinical  observations, patient history, and epidemiological information. If result is POSITIVE SARS-CoV-2 target nucleic acids are DETECTED. The SARS-CoV-2 RNA is generally detectable in upper and lower  respiratory specimens dur ing the acute phase of infection.  Positive  results are indicative of active infection with SARS-CoV-2.  Clinical  correlation with patient history and other diagnostic information is  necessary to determine patient infection status.  Positive results do  not rule out bacterial infection or co-infection with other viruses. If result is PRESUMPTIVE POSTIVE SARS-CoV-2 nucleic acids MAY BE PRESENT.   A presumptive positive result was obtained on the submitted specimen  and confirmed on repeat testing.  While 2019 novel coronavirus  (SARS-CoV-2) nucleic acids may be present in the submitted sample  additional confirmatory testing may be necessary for epidemiological  and / or clinical management purposes  to differentiate between  SARS-CoV-2 and other Sarbecovirus currently known to infect humans.  If clinically indicated additional testing with an alternate test  methodology [phone]) is advised. The SARS-CoV-2 RNA is generally  detectable in upper and lower respiratory sp ecimens during the acute  phase of infection. The expected result is Negative. Fact Sheet for Patients:  BoilerBrush.com.cy Fact Sheet for Healthcare Providers: https://pope.com/ This test is not yet approved or cleared by the [place] FDA and has been authorized for detection and/or diagnosis of SARS-CoV-2 by FDA under an Emergency Use Authorization (EUA).  This EUA will remain in effect (meaning this test can be used) for the duration of the COVID-19 declaration under Section 564(b)(1) of the Act, 21 U.S.C.  section 360bbb-3(b)(1), unless the authorization is terminated or revoked sooner. Performed at [facility], [address] [place], [place] [address]          Radiology Studies: No results found.      Scheduled Meds: . heparin  5,000 Units Subcutaneous Q8H   Continuous Infusions: . sodium chloride 200 mL/hr at 02/04/19 1110     LOS: 0 days    Time spent: 34 minutes spent on chart review, discussion with nursing staff, consultants, updating family and interview/physical exam; more than 50% of that time was spent in counseling and/or coordination of care.    Alvira Philips [place], DO [facility] 02/04/2019, 12:20 PM

## 2019-02-04 NOTE — Plan of Care (Signed)
Pt had a very poor appetite. His dinner tray was untouched. Pt has a flat  affect.  Pt did have a BM this shift.

## 2019-02-04 NOTE — Evaluation (Signed)
Physical Therapy Evaluation Patient Details Name: Brad [name] MRN: [id number] DOB: 06-Sep-1965 Today's Date: 02/04/2019   History of Present Illness  53 y.o. male with medical history significant of CKD and possible drug use Per EDP report - Pts family called sheriffs dept because pt was acting odd after taking 2 xanax. Narcan given by EMS w/o improvement.Lengthy ED workup in part due to pts intermittent refusal of care and testing.  admitted with Rhabdomyolysis  Altered mental status associated with intoxication,  AKI,  Transaminitis  Clinical Impression  Pt admitted with above diagnosis.  Pt mildly impulsive with one episode of knee buckling yet independent recovery.  Will continue to follow in acute setting, do not feel pt will need PT f/u post acute   Pt currently with functional limitations due to the deficits listed below (see PT Problem List). Pt will benefit from skilled PT to increase their independence and safety with mobility to allow discharge to the venue listed below.       Follow Up Recommendations No PT follow up    Equipment Recommendations  (TBA)    Recommendations for Other Services       Precautions / Restrictions Precautions Precautions: Fall Restrictions Weight Bearing Restrictions: No      Mobility  Bed Mobility Overal bed mobility: Needs Assistance Bed Mobility: Supine to Sit;Sit to Supine     Supine to sit: Supervision Sit to supine: Supervision   General bed mobility comments: for safety  Transfers Overall transfer level: Needs assistance Equipment used: Rolling walker (2 wheeled) Transfers: Sit to/from Stand Sit to Stand: Supervision;Min guard         General transfer comment: for safety and d/t fall pad on floor--pt unsteady  Ambulation/Gait Ambulation/Gait assistance: Min guard Gait Distance (Feet): 200 Feet Assistive device: Rolling walker (2 wheeled) Gait Pattern/deviations: Step-through pattern     General Gait  Details: cues for RW position. pt without LOB however when PT released gait belt briefly pt bil knees buckled significantly, pt was able to recover without actual assist,  hand on gait belt  Stairs            Wheelchair Mobility    Modified Rankin (Stroke Patients Only)       Balance Overall balance assessment: Needs assistance Sitting-balance support: Feet supported Sitting balance-Leahy Scale: Good       Standing balance-Leahy Scale: Fair                               Pertinent Vitals/Pain Pain Assessment: No/denies pain    Home Living Family/patient expects to be discharged to:: Private residence Living Arrangements: Parent(lives with his dad per pt)   Type of Home: House       Home Layout: One level Home Equipment: None      Prior Function Level of Independence: Independent               Hand Dominance        Extremity/Trunk Assessment   Upper Extremity Assessment Upper Extremity Assessment: Overall WFL for tasks assessed    Lower Extremity Assessment Lower Extremity Assessment: Overall WFL for tasks assessed       Communication   Communication: No difficulties  Cognition Arousal/Alertness: Awake/alert Behavior During Therapy: Impulsive(one episode of knee buckling during gait unexcpectedly) Overall Cognitive Status: Within Functional Limits for tasks assessed  General Comments      Exercises     Assessment/Plan    PT Assessment Patient needs continued PT services  PT Problem List Decreased strength;Decreased activity tolerance;Decreased mobility;Pain;Decreased knowledge of use of DME;Decreased safety awareness       PT Treatment Interventions DME instruction;Gait training;Functional mobility training;Therapeutic activities;Patient/family education;Therapeutic exercise;Balance training    PT Goals (Current goals can be found in the Care Plan section)  Acute  Rehab PT Goals PT Goal Formulation: With patient Time For Goal Achievement: 02/18/19 Potential to Achieve Goals: Good    Frequency Min 3X/week   Barriers to discharge        Co-evaluation               AM-PAC PT "6 Clicks" Mobility  Outcome Measure Help needed turning from your back to your side while in a flat bed without using bedrails?: None Help needed moving from lying on your back to sitting on the side of a flat bed without using bedrails?: None Help needed moving to and from a bed to a chair (including a wheelchair)?: A Little Help needed standing up from a chair using your arms (e.g., wheelchair or bedside chair)?: A Little Help needed to walk in hospital room?: A Little Help needed climbing 3-5 steps with a railing? : A Little 6 Click Score: 20    End of Session Equipment Utilized During Treatment: Gait belt Activity Tolerance: Patient tolerated treatment well Patient left: with call bell/phone within reach;in bed;with bed alarm set Nurse Communication: Mobility status PT Visit Diagnosis: Difficulty in walking, not elsewhere classified (R26.2)    Time: 1749-4496 PT Time Calculation (min) (ACUTE ONLY): 13 min   Charges:   PT Evaluation $PT Eval Low Complexity: 1 Low          Drucilla Chalet, PT  Pager: [phone] Acute Rehab Dept [facility]): [phone]   02/04/2019   [facility] 02/04/2019, 3:07 PM

## 2019-02-05 DIAGNOSIS — I10 Essential (primary) hypertension: Secondary | ICD-10-CM | POA: Diagnosis present

## 2019-02-05 LAB — COMPREHENSIVE METABOLIC PANEL
ALT: 86 U/L — ABNORMAL HIGH (ref 0–44)
AST: 106 U/L — ABNORMAL HIGH (ref 15–41)
Albumin: 2.8 g/dL — ABNORMAL LOW (ref 3.5–5.0)
Alkaline Phosphatase: 42 U/L (ref 38–126)
Anion gap: 8 (ref 5–15)
BUN: 10 mg/dL (ref 6–20)
CO2: 17 mmol/L — ABNORMAL LOW (ref 22–32)
Calcium: 7.9 mg/dL — ABNORMAL LOW (ref 8.9–10.3)
Chloride: 115 mmol/L — ABNORMAL HIGH (ref 98–111)
Creatinine, Ser: 0.81 mg/dL (ref 0.61–1.24)
GFR calc Af Amer: >60 mL/min (ref >60)
GFR calc non Af Amer: >60 mL/min (ref >60)
Glucose, Bld: 88 mg/dL (ref 70–99)
Potassium: 3.6 mmol/L (ref 3.5–5.1)
Sodium: 140 mmol/L (ref 135–145)
Total Bilirubin: 0.7 mg/dL (ref 0.3–1.2)
Total Protein: 5.4 g/dL — ABNORMAL LOW (ref 6.5–8.1)

## 2019-02-05 LAB — CK: Total CK: 3509 U/L — ABNORMAL HIGH (ref 49–397)

## 2019-02-05 MED ORDER — ACETAMINOPHEN 325 MG PO TABS
650.0000 mg | ORAL_TABLET | Freq: Once | ORAL | Status: AC
  Administered 2019-02-05: 650 mg via ORAL
  Filled 2019-02-05: qty 2

## 2019-02-05 MED ORDER — AMLODIPINE BESYLATE 5 MG PO TABS
5.0000 mg | ORAL_TABLET | Freq: Every day | ORAL | Status: DC
  Administered 2019-02-05: 10:00:00 5 mg via ORAL
  Filled 2019-02-05: qty 1

## 2019-02-05 MED ORDER — AMLODIPINE BESYLATE 5 MG PO TABS: 5.0000 mg | ORAL_TABLET | Freq: Every day | ORAL | 0 refills | Status: AC

## 2019-02-05 NOTE — Discharge Summary (Signed)
Physician Discharge Summary  KASHUS KARLEN SAY:[id number] DOB: 05-27-1965 DOA: 02/02/2019  PCP: Damaris Hippo, MD (Inactive)  Admit date: 02/02/2019 Discharge date: 02/05/2019  Admitted From: Home Disposition:  Home  Recommendations for Outpatient Follow-up:  1. Follow up with PCP in 1-2 weeks 2. Started on amlodipine 5 mg p.o. daily for uncontrolled hypertension  Home Health: No Equipment/Devices: None  Discharge Condition: Stable CODE STATUS: Full code Diet recommendation: Heart Healthy   History of present illness:  Brad [name] a 53 y.o.malewith medical history significant ofCKD and possible drug use. Unable to obtain full history due to pts AMS. Per EDP report - Pts family called sheriffs dept because pt was acting odd after taking 2 xanax. Narcan given by EMS w/o improvement.  Pt unable to give reliable history and would simply repeat whatever I would say/ask.  ED Course:Lengthy ED workup in part due to pts intermittent refusal of care and testing  Hospital course:  Rhabdomyolysis Patient presenting to ED via EMS after family called Sheriff's department because patient was acting strangely.  On presentation patient was noted to have an elevated CK level of 6784.  Patient unable to explain events leading up to hospitalization.  No reports that patient was found down for prolonged period time, no apparent trauma.  Suspect possibly drug-induced.  Patient received 2 L normal saline bolus in the ED. patient was continued on aggressive IV fluid hydration with NS at 200 mL's per hour.  Patient's CK level peaked at 8801 and improved to 3000 at time of discharge.  Patient was seen by physical therapy with no further recommendations.  Discussed with patient needs to avoid illicit drugs as this was likely etiology of his underlying rhabdomyolysis.  Acute renal failure Creatinine 2.59 on presentation, last baseline 1.10 01/05/2019.  Etiology likely pigment induced  from underlying rhabdomyolysis with elevated myoglobin on urinalysis.  Patient was aggressively hydrated during his hospital course with resolution of his renal failure with creatinine 0.1 at time of discharge.  Transaminitis Patient presenting with AST/ALT elevation; 252 and 225 respectively.  UDS positive for benzos.  EtOH level less than 10 and salicylate level less than 7.0.  Suspect a drug-induced liver injury versus shock liver from possible hypotension prior to presentation.  LFTs trended down during hospitalization.  Essential hypertension Blood pressure continue to trend up during hospitalization, will start on amlodipine 5 mg p.o. daily with better control.  Will need close follow-up with PCP for further monitoring of BP and adjustments if necessary.  Substance abuse Patient denies any history of drug abuse.  UDS positive for benzos, denies that he takes any prescribed medication.  Patient counseled.  Discharge Diagnoses:  Active Problems:   Benign essential HTN    Discharge Instructions  Discharge Instructions    Call MD for:  difficulty breathing, headache or visual disturbances   Complete by: As directed    Call MD for:  extreme fatigue   Complete by: As directed    Call MD for:  persistant dizziness or light-headedness   Complete by: As directed    Call MD for:  persistant nausea and vomiting   Complete by: As directed    Call MD for:  severe uncontrolled pain   Complete by: As directed    Call MD for:  temperature >100.4   Complete by: As directed    Diet - low sodium heart healthy   Complete by: As directed    Increase activity slowly   Complete by: As directed  Allergies as of 02/05/2019   No Known Allergies     Medication List    TAKE these medications   amLODipine 5 MG tablet Commonly known as: NORVASC Take 1 tablet (5 mg total) by mouth daily. Start taking on: February 06, 2019      Follow-up Information    Hoyt Koch, MD. Schedule an  appointment as soon as possible for a visit in 1 week(s).   Specialty: Family Medicine Contact information: [address] [place] [address] [place] [address] [phone]          No Known Allergies  Consultations:  None   Procedures/Studies:  No results found.   Subjective: Patient seen and examined at bedside, resting comfortably in bedside chair.  No complaints this morning.  Mentation markedly improved.  No further falls and feels stable while ambulating without any aides.  Blood pressure improved on amlodipine.  Updated patient's father via telephone.  No other complaints or concerns at this time.  Denies headache, no fever/chills/night sweats, no nausea/vomiting/diarrhea, no chest pain, no palpitations, no shortness of breath, no abdominal pain, no cough/congestion.  No acute events overnight per nursing staff.   Discharge Exam: Vitals:   02/05/19 0457 02/05/19 1345  BP: (!) 174/84 (!) 154/83  Pulse: (!) 50 76  Resp: 16 15  Temp: 99 F (37.2 C)   SpO2: 100%    Vitals:   02/04/19 1419 02/04/19 2111 02/05/19 0457 02/05/19 1345  BP: (!) 171/99 (!) 174/88 (!) 174/84 (!) 154/83  Pulse: 68 (!) 50 (!) 50 76  Resp: 18 16 16 15   Temp: 98.3 F (36.8 C) 98.9 F (37.2 C) 99 F (37.2 C)   TempSrc: Oral Oral Oral   SpO2: 98% 99% 100%   Weight:      Height:        General: Pt is alert, awake, not in acute distress Cardiovascular: RRR, S1/S2 +, no rubs, no gallops Respiratory: CTA bilaterally, no wheezing, no rhonchi Abdominal: Soft, NT, ND, bowel sounds + Extremities: no edema, no cyanosis    The results of significant diagnostics from this hospitalization (including imaging, microbiology, ancillary and laboratory) are listed below for reference.     Microbiology: Recent Results (from the past 240 hour(s))  SARS Coronavirus 2 by RT PCR (hospital order, performed in [facility] hospital lab) Nasopharyngeal Nasopharyngeal Swab     Status: None   Collection Time:  02/03/19  1:17 AM   Specimen: Nasopharyngeal Swab  Result Value Ref Range Status   SARS Coronavirus 2 NEGATIVE NEGATIVE Final    Comment: (NOTE) If result is NEGATIVE SARS-CoV-2 target nucleic acids are NOT DETECTED. The SARS-CoV-2 RNA is generally detectable in upper and lower  respiratory specimens during the acute phase of infection. The lowest  concentration of SARS-CoV-2 viral copies this assay can detect is 250  copies / mL. A negative result does not preclude SARS-CoV-2 infection  and should not be used as the sole basis for treatment or other  patient management decisions.  A negative result may occur with  improper specimen collection / handling, submission of specimen other  than nasopharyngeal swab, presence of viral mutation(s) within the  areas targeted by this assay, and inadequate number of viral copies  (<250 copies / mL). A negative result must be combined with clinical  observations, patient history, and epidemiological information. If result is POSITIVE SARS-CoV-2 target nucleic acids are DETECTED. The SARS-CoV-2 RNA is generally detectable in upper and lower  respiratory specimens dur ing  the acute phase of infection.  Positive  results are indicative of active infection with SARS-CoV-2.  Clinical  correlation with patient history and other diagnostic information is  necessary to determine patient infection status.  Positive results do  not rule out bacterial infection or co-infection with other viruses. If result is PRESUMPTIVE POSTIVE SARS-CoV-2 nucleic acids MAY BE PRESENT.   A presumptive positive result was obtained on the submitted specimen  and confirmed on repeat testing.  While 2019 novel coronavirus  (SARS-CoV-2) nucleic acids may be present in the submitted sample  additional confirmatory testing may be necessary for epidemiological  and / or clinical management purposes  to differentiate between  SARS-CoV-2 and other Sarbecovirus currently known to  infect humans.  If clinically indicated additional testing with an alternate test  methodology [phone]([id number]) is advised. The SARS-CoV-2 RNA is generally  detectable in upper and lower respiratory sp ecimens during the acute  phase of infection. The expected result is Negative. Fact Sheet for Patients:  BoilerBrush.com.cyhttps://www.fda.gov/media/136312/download Fact Sheet for Healthcare Providers: https://pope.com/https://www.fda.gov/media/136313/download This test is not yet approved or cleared by the [place] FDA and has been authorized for detection and/or diagnosis of SARS-CoV-2 by FDA under an Emergency Use Authorization (EUA).  This EUA will remain in effect (meaning this test can be used) for the duration of the COVID-19 declaration under Section 564(b)(1) of the Act, 21 U.S.C. section 360bbb-3(b)(1), unless the authorization is terminated or revoked sooner. Performed at [facility], [address] [place], [place] [address]      Labs: BNP (last 3 results) No results for input(s): BNP in the last 8760 hours. Basic Metabolic Panel: Recent Labs  Lab 02/02/19 2116 02/03/19 0420 02/04/19 0414 02/05/19 0358  NA 140 138 140 140  K 5.0 4.5 3.9 3.6  CL 106 110 115* 115*  CO2 21* 20* 18* 17*  GLUCOSE 64* 148* 90 88  BUN 34* 34* 16 10  CREATININE 2.59* 2.01* 0.92 0.81  CALCIUM 9.4 8.2* 8.0* 7.9*   Liver Function Tests: Recent Labs  Lab 02/02/19 2116 02/03/19 0420 02/04/19 0414 02/05/19 0358  AST 252* 187* 129* 106*  ALT 225* 161* 108* 86*  ALKPHOS 67 53 42 42  BILITOT 0.5 0.8 0.4 0.7  PROT 8.3* 6.3* 5.5* 5.4*  ALBUMIN 4.5 3.3* 2.9* 2.8*   No results for input(s): LIPASE, AMYLASE in the last 168 hours. No results for input(s): AMMONIA in the last 168 hours. CBC: Recent Labs  Lab 02/02/19 2116 02/03/19 0420  WBC 12.1* 9.8  NEUTROABS 10.6*  --   HGB 15.8 13.1  HCT 45.9 38.8*  MCV 93.3 94.2  PLT 200 149*   Cardiac Enzymes: Recent Labs  Lab 02/02/19 2116  02/03/19 0420 02/04/19 0414 02/05/19 0358  CKTOTAL 6,784* 8,801* 4,885* 3,509*   BNP: Invalid input(s): POCBNP CBG: Recent Labs  Lab 02/03/19 0111 02/03/19 0126  GLUCAP 67* 170*   D-Dimer No results for input(s): DDIMER in the last 72 hours. Hgb A1c No results for input(s): HGBA1C in the last 72 hours. Lipid Profile No results for input(s): CHOL, HDL, LDLCALC, TRIG, CHOLHDL, LDLDIRECT in the last 72 hours. Thyroid function studies No results for input(s): TSH, T4TOTAL, T3FREE, THYROIDAB in the last 72 hours.  Invalid input(s): FREET3 Anemia work up No results for input(s): VITAMINB12, FOLATE, FERRITIN, TIBC, IRON, RETICCTPCT in the last 72 hours. Urinalysis    Component Value Date/Time   COLORURINE YELLOW 02/02/2019 2215   APPEARANCEUR HAZY (A) 02/02/2019 2215   LABSPEC  1.014 02/02/2019 2215   PHURINE 5.0 02/02/2019 2215   GLUCOSEU >=500 (A) 02/02/2019 2215   HGBUR LARGE (A) 02/02/2019 2215   BILIRUBINUR NEGATIVE 02/02/2019 2215   BILIRUBINUR negative 01/25/2015 1116   KETONESUR 5 (A) 02/02/2019 2215   PROTEINUR 100 (A) 02/02/2019 2215   UROBILINOGEN 0.2 06/08/2015 1350   NITRITE NEGATIVE 02/02/2019 2215   LEUKOCYTESUR NEGATIVE 02/02/2019 2215   Sepsis Labs Invalid input(s): PROCALCITONIN,  WBC,  LACTICIDVEN Microbiology Recent Results (from the past 240 hour(s))  SARS Coronavirus 2 by RT PCR (hospital order, performed in [facility] hospital lab) Nasopharyngeal Nasopharyngeal Swab     Status: None   Collection Time: 02/03/19  1:17 AM   Specimen: Nasopharyngeal Swab  Result Value Ref Range Status   SARS Coronavirus 2 NEGATIVE NEGATIVE Final    Comment: (NOTE) If result is NEGATIVE SARS-CoV-2 target nucleic acids are NOT DETECTED. The SARS-CoV-2 RNA is generally detectable in upper and lower  respiratory specimens during the acute phase of infection. The lowest  concentration of SARS-CoV-2 viral copies this assay can detect is 250  copies / mL. A negative  result does not preclude SARS-CoV-2 infection  and should not be used as the sole basis for treatment or other  patient management decisions.  A negative result may occur with  improper specimen collection / handling, submission of specimen other  than nasopharyngeal swab, presence of viral mutation(s) within the  areas targeted by this assay, and inadequate number of viral copies  (<250 copies / mL). A negative result must be combined with clinical  observations, patient history, and epidemiological information. If result is POSITIVE SARS-CoV-2 target nucleic acids are DETECTED. The SARS-CoV-2 RNA is generally detectable in upper and lower  respiratory specimens dur ing the acute phase of infection.  Positive  results are indicative of active infection with SARS-CoV-2.  Clinical  correlation with patient history and other diagnostic information is  necessary to determine patient infection status.  Positive results do  not rule out bacterial infection or co-infection with other viruses. If result is PRESUMPTIVE POSTIVE SARS-CoV-2 nucleic acids MAY BE PRESENT.   A presumptive positive result was obtained on the submitted specimen  and confirmed on repeat testing.  While 2019 novel coronavirus  (SARS-CoV-2) nucleic acids may be present in the submitted sample  additional confirmatory testing may be necessary for epidemiological  and / or clinical management purposes  to differentiate between  SARS-CoV-2 and other Sarbecovirus currently known to infect humans.  If clinically indicated additional testing with an alternate test  methodology [phone]) is advised. The SARS-CoV-2 RNA is generally  detectable in upper and lower respiratory sp ecimens during the acute  phase of infection. The expected result is Negative. Fact Sheet for Patients:  BoilerBrush.com.cy Fact Sheet for Healthcare Providers: https://pope.com/ This test is not yet  approved or cleared by the [place] FDA and has been authorized for detection and/or diagnosis of SARS-CoV-2 by FDA under an Emergency Use Authorization (EUA).  This EUA will remain in effect (meaning this test can be used) for the duration of the COVID-19 declaration under Section 564(b)(1) of the Act, 21 U.S.C. section 360bbb-3(b)(1), unless the authorization is terminated or revoked sooner. Performed at [facility], [address], [place], [place] [address]      Time coordinating discharge: Over 30 minutes  SIGNED:   Alvira Philips [place], DO  [facility] 02/05/2019, 2:04 PM

## 2019-02-05 NOTE — Discharge Instructions (Signed)
Accidental Drug Poisoning, Adult Accidental drug poisoning happens when a person accidentally takes too much of a substance, such as a prescription medicine, an over-the-counter medicine, a vitamin, a supplement, or an illegal drug. The effects of drug poisoning can be mild, dangerous, or even deadly. What are the causes? This condition is caused by taking too much of a medicine, illegal drug, or other substance. It often results from:  Lack of knowledge about a substance.  Using more than one substance at the same time.  An error made by the health care provider who prescribed the substance.  An error made by the pharmacist who filled the prescription.  A lapse in memory, such as forgetting that you have already taken a dose of the medicine.  Suddenly using a substance after a long period of not using it. The following substances and medicines are more likely to cause an accidental drug poisoning:  Medicines that treat mental problems (psychotropic medicines).  Pain medicines.  Cocaine.  Heroin.  Multivitamins that contain iron.  Over-the-counter cold and cough medicines. What increases the risk? This condition is more likely to occur in:  Elderly adults. Elderly adults are at risk because they may: ? Be taking many different medicines. ? Have difficulty reading labels. ? Forget when they last took their medicine.  People who use illegal drugs.  People who drink alcohol while using illegal drugs or certain medicines.  People with certain mental health conditions. What are the signs or symptoms? Symptoms of this condition depend on the substance and the amount that was taken. Common symptoms include:  Behavior changes, such as confusion.  Sleepiness.  Weakness.  Slowed breathing.  Nausea and vomiting.  Seizures.  Very large or small eye pupil size. A drug poisoning can cause a very serious condition in which your blood pressure drops to a low level  (shock). Symptoms of shock include:  Cold and clammy skin.  Pale skin.  Blue lips.  Very slow breathing.  Extreme sleepiness.  Severe confusion.  Dizziness or fainting. How is this diagnosed? This condition is diagnosed based on:  Your symptoms. You will be asked about the substances you took and when you took them.  A physical exam. You may also have other tests, including:  Urine tests.  Blood tests.  An electrocardiogram (ECG). How is this treated? This condition may need to be treated right away at the hospital. Treatment may involve:  Getting fluids and electrolytes through an IV.  Having a breathing tube inserted in your airway (endotracheal tube) to help you breathe.  Taking medicines. These may include medicines that: ? Absorb any substance that is in your digestive system. ? Block or reverse the effect of the substance that caused the drug poisoning.  Having your blood filtered through an artificial kidney machine (hemodialysis).  Ongoing counseling and mental health support. This may be provided if you used an illegal drug. Follow these instructions at home: Medicines   Take over-the-counter and prescription medicines only as told by your health care provider.  Before taking a new medicine, ask your health care provider whether the medicine: ? May cause side effects. ? Might react with other medicines.  Keep a list of all the medicines that you take, including over-the-counter medicines, vitamins, supplements, and herbs. Bring this list with you to all of your medical visits. General instructions   Drink enough fluid to keep your urine pale yellow.  If you are working with a Social worker or Education officer, community,  make sure to follow his or her instructions.  Do not drink alcohol if: ? Your health care provider tells you not to drink. ? You are pregnant, may be pregnant, or are planning to become pregnant.  If you drink alcohol, limit how  much you have: ? 0-1 drink a day for women. ? 0-2 drinks a day for men.  Be aware of how much alcohol is in your drink. In the [place] one drink equals one typical bottle of beer (12 oz), one-half glass of wine (5 oz), or one shot of hard liquor (1 oz).  Keep all follow-up visits as told by your health care provider. This is important. How is this prevented?   Get help if you are struggling with: ? Alcohol or drug use. ? Depression or another mental health problem.  Keep the phone number of your local poison control center near your phone or on your cell phone. The hotline of the [employer] is (8005028049253.  Store all medicines in safety containers that are out of the reach of children.  Read the drug inserts that come with your medicines.  Create a system for taking your medicine, such as a pillbox, that will help you avoid taking too much of the medicine.  Do not drink alcohol while taking medicines unless your health care provider approves.  Do not use illegal drugs.  Do not take medicines that are not prescribed for you. Contact a health care provider if:  Your symptoms return.  You develop new symptoms or side effects after taking a medicine.  You have questions about possible drug poisoning. Call your local poison control center at (725)047-6465. Get help right away if:  You think that you or someone else may have taken too much of a substance.  You or someone else is having symptoms of drug poisoning. Summary  Accidental drug poisoning happens when a person accidentally takes too much of a substance, such as a prescription medicine, an over-the-counter medicine, a vitamin, a supplement, or an illegal drug.  The effects of drug poisoning can be mild, dangerous, or even deadly.  This condition is diagnosed based on your symptoms and a physical exam. You will be asked to tell your health care provider which substances you took and  when you took them.  This condition may need to be treated right away at the hospital. This information is not intended to replace advice given to you by your health care provider. Make sure you discuss any questions you have with your health care provider. Document Released: 06/05/2004 Document Revised: 03/04/2017 Document Reviewed: 02/21/2017 Elsevier Patient Education  2020 Elsevier Inc.  Acute Kidney Injury, Adult  Acute kidney injury is a sudden worsening of kidney function. The kidneys are organs that have several jobs. They filter the blood to remove waste products and extra fluid. They also maintain a healthy balance of minerals and hormones in the body, which helps control blood pressure and keep bones strong. With this condition, your kidneys do not do their jobs as well as they should. This condition ranges from mild to severe. Over time it may develop into long-lasting (chronic) kidney disease. Early detection and treatment may prevent acute kidney injury from developing into a chronic condition. What are the causes? Common causes of this condition include:  A problem with blood flow to the kidneys. This may be caused by: ? Low blood pressure (hypotension) or shock. ? Blood loss. ? Heart and blood  vessel (cardiovascular) disease. ? Severe burns. ? Liver disease.  Direct damage to the kidneys. This may be caused by: ? Certain medicines. ? A kidney infection. ? Poisoning. ? Being around or in contact with toxic substances. ? A surgical wound. ? A hard, direct hit to the kidney area.  A sudden blockage of urine flow. This may be caused by: ? Cancer. ? Kidney stones. ? An enlarged prostate in males. What are the signs or symptoms? Symptoms of this condition may not be obvious until the condition becomes severe. Symptoms of this condition can include:  Tiredness (lethargy), or difficulty staying awake.  Nausea or vomiting.  Swelling (edema) of the face, legs, ankles,  or feet.  Problems with urination, such as: ? Abdominal pain, or pain along the side of your stomach (flank). ? Decreased urine production. ? Decrease in the force of urine flow.  Muscle twitches and cramps, especially in the legs.  Confusion or trouble concentrating.  Loss of appetite.  Fever. How is this diagnosed? This condition may be diagnosed with tests, including:  Blood tests.  Urine tests.  Imaging tests.  A test in which a sample of tissue is removed from the kidneys to be examined under a microscope (kidney biopsy). How is this treated? Treatment for this condition depends on the cause and how severe the condition is. In mild cases, treatment may not be needed. The kidneys may heal on their own. In more severe cases, treatment will involve:  Treating the cause of the kidney injury. This may involve changing any medicines you are taking or adjusting your dosage.  Fluids. You may need specialized IV fluids to balance your body's needs.  Having a catheter placed to drain urine and prevent blockages.  Preventing problems from occurring. This may mean avoiding certain medicines or procedures that can cause further injury to the kidneys. In some cases treatment may also require:  A procedure to remove toxic wastes from the body (dialysis or continuous renal replacement therapy - CRRT).  Surgery. This may be done to repair a torn kidney, or to remove the blockage from the urinary system. Follow these instructions at home: Medicines  Take over-the-counter and prescription medicines only as told by your health care provider.  Do not take any new medicines without your health care provider's approval. Many medicines can worsen your kidney damage.  Do not take any vitamin and mineral supplements without your health care provider's approval. Many nutritional supplements can worsen your kidney damage. Lifestyle  If your health care provider prescribed changes to your  diet, follow them. You may need to decrease the amount of protein you eat.  Achieve and maintain a healthy weight. If you need help with this, ask your health care provider.  Start or continue an exercise plan. Try to exercise at least 30 minutes a day, 5 days a week.  Do not use any tobacco products, such as cigarettes, chewing tobacco, and e-cigarettes. If you need help quitting, ask your health care provider. General instructions  Keep track of your blood pressure. Report changes in your blood pressure as told by your health care provider.  Stay up to date with immunizations. Ask your health care provider which immunizations you need.  Keep all follow-up visits as told by your health care provider. This is important. Where to find more information  American Association of Kidney Patients: ResidentialShow.is  [employer]: www.kidney.org  American [employer]: FightingMatch.com.ee  [employer] [facility]: ? www.lifeoptions.org ? www.kidneyschool.org  Contact a health care provider if:  Your symptoms get worse.  You develop new symptoms. Get help right away if:  You develop symptoms of worsening kidney disease, which include: ? Headaches. ? Abnormally dark or light skin. ? Easy bruising. ? Frequent hiccups. ? Chest pain. ? Shortness of breath. ? End of menstruation in women. ? Seizures. ? Confusion or altered mental status. ? Abdominal or back pain. ? Itchiness.  You have a fever.  Your body is producing less urine.  You have pain or bleeding when you urinate. Summary  Acute kidney injury is a sudden worsening of kidney function.  Acute kidney injury can be caused by problems with blood flow to the kidneys, direct damage to the kidneys, and sudden blockage of urine flow.  Symptoms of this condition may not be obvious until it becomes severe. Symptoms may include edema, lethargy, confusion, nausea or vomiting, and problems passing urine.  This  condition can usually be diagnosed with blood tests, urine tests, and imaging tests. Sometimes a kidney biopsy is done to diagnose this condition.  Treatment for this condition often involves treating the underlying cause. It is treated with fluids, medicines, dialysis, diet changes, or surgery. This information is not intended to replace advice given to you by your health care provider. Make sure you discuss any questions you have with your health care provider. Document Released: 10/05/2010 Document Revised: 03/04/2017 Document Reviewed: 03/12/2016 Elsevier Patient Education  2020 [employer].  Rhabdomyolysis Rhabdomyolysis is a condition that happens when muscle cells break down and release substances into the blood that can damage the kidneys. This condition happens because of damage to the muscles that move bones (skeletal muscle). When the skeletal muscles are damaged, substances inside the muscle cells go into the blood. One of these substances is a protein called myoglobin. Large amounts of myoglobin can cause kidney damage or kidney failure. Other substances that are released by muscle cells may upset the balance of the minerals (electrolytes) in your blood. This imbalance causes your blood to have too much acid (acidosis). What are the causes? This condition is caused by muscle damage. Muscle damage often happens because of:  Using your muscles too much.  An injury that crushes or squeezes a muscle too tightly.  Using illegal drugs, mainly cocaine.  Alcohol abuse. Other possible causes include:  Prescription medicines, such as those that: ? Lower cholesterol (statins). ? Treat ADHD (attention deficit hyperactivity disorder) or help with weight loss (amphetamines). ? Treat pain (opiates).  Infections.  Muscle diseases that are passed down from parent to child (inherited).  High fever.  Heatstroke.  Not having enough fluids in your body  (dehydration).  Seizures.  Surgery. What increases the risk? This condition is more likely to develop in people who:  Have a family history of muscle disease.  Take part in extreme sports, such as running in marathons.  Have diabetes.  Are older.  Abuse drugs or alcohol. What are the signs or symptoms? Symptoms of this condition vary. Some people have very few symptoms, and other people have many symptoms. The most common symptoms include:  Muscle pain and swelling.  Weak muscles.  Dark urine.  Feeling weak and tired. Other symptoms include:  Nausea and vomiting.  Fever.  Pain in the abdomen.  Pain in the joints. Symptoms of complications from this condition include:  Heart rhythm that is not normal (arrhythmia).  Seizures.  Not urinating enough because of kidney failure.  Very low blood pressure (shock).  Signs of shock include dizziness, blurry vision, and clammy skin.  Bleeding that is hard to stop or control. How is this diagnosed? This condition is diagnosed based on your medical history, your symptoms, and a physical exam. Tests may also be done, including:  Blood tests.  Urine tests to check for myoglobin. You may also have other tests to check for causes of muscle damage and to check for complications. How is this treated? Treatment for this condition helps to:  Make sure you have enough fluids in your body.  Lower the acid levels in your blood to reverse acidosis.  Protect your kidneys. Treatment may include:  Fluids and medicines given through an IV tube that is inserted into one of your veins.  Medicines to lower acidosis or to bring back the balance of the minerals in your body.  Hemodialysis. This treatment uses an artificial kidney machine to filter your blood while you recover. You may have this if other treatments are not helping. Follow these instructions at home:   Take over-the-counter and prescription medicines only as told by  your health care provider.  Rest at home until your health care provider says that you can return to your normal activities.  Drink enough fluid to keep your urine clear or pale yellow.  Do not do activities that take a lot of effort (are strenuous). Ask your health care provider what level of exercise is safe for you.  Do not abuse drugs or alcohol. If you are having problems with drug or alcohol use, ask your health care provider for help.  Keep all follow-up visits as told by your health care provider. This is important. Contact a health care provider if:  You start having symptoms of this condition after treatment. Get help right away if:  You have a seizure.  You bleed easily or cannot control bleeding.  You cannot urinate.  You have chest pain.  You have trouble breathing. This information is not intended to replace advice given to you by your health care provider. Make sure you discuss any questions you have with your health care provider. Document Released: 03/04/2004 Document Revised: 03/04/2017 Document Reviewed: 01/02/2016 Elsevier Patient Education  2020 [employer] Hypertension, Adult Hypertension is another name for high blood pressure. High blood pressure forces your heart to work harder to pump blood. This can cause problems over time. There are two numbers in a blood pressure reading. There is a top number (systolic) over a bottom number (diastolic). It is best to have a blood pressure that is below 120/80. Healthy choices can help lower your blood pressure, or you may need medicine to help lower it. What are the causes? The cause of this condition is not known. Some conditions may be related to high blood pressure. What increases the risk?  Smoking.  Having type 2 diabetes mellitus, high cholesterol, or both.  Not getting enough exercise or physical activity.  Being overweight.  Having too much fat, sugar, calories, or salt (sodium) in your  diet.  Drinking too much alcohol.  Having long-term (chronic) kidney disease.  Having a family history of high blood pressure.  Age. Risk increases with age.  Race. You may be at higher risk if you are African American.  Gender. Men are at higher risk than women before age 845. After age 55, women are at higher risk than men.  Having obstructive sleep apnea.  Stress. What are the signs or symptoms?  High blood pressure may not cause symptoms.  Very high blood pressure (hypertensive crisis) may cause: ? Headache. ? Feelings of worry or nervousness (anxiety). ? Shortness of breath. ? Nosebleed. ? A feeling of being sick to your stomach (nausea). ? Throwing up (vomiting). ? Changes in how you see. ? Very bad chest pain. ? Seizures. How is this treated?  This condition is treated by making healthy lifestyle changes, such as: ? Eating healthy foods. ? Exercising more. ? Drinking less alcohol.  Your health care provider may prescribe medicine if lifestyle changes are not enough to get your blood pressure under control, and if: ? Your top number is above 130. ? Your bottom number is above 80.  Your personal target blood pressure may vary. Follow these instructions at home: Eating and drinking   If told, follow the DASH eating plan. To follow this plan: ? Fill one half of your plate at each meal with fruits and vegetables. ? Fill one fourth of your plate at each meal with whole grains. Whole grains include whole-wheat pasta, brown rice, and whole-grain bread. ? Eat or drink low-fat dairy products, such as skim milk or low-fat yogurt. ? Fill one fourth of your plate at each meal with low-fat (lean) proteins. Low-fat proteins include fish, chicken without skin, eggs, beans, and tofu. ? Avoid fatty meat, cured and processed meat, or chicken with skin. ? Avoid pre-made or processed food.  Eat less than 1,500 mg of salt each day.  Do not drink alcohol if: ? Your doctor tells  you not to drink. ? You are pregnant, may be pregnant, or are planning to become pregnant.  If you drink alcohol: ? Limit how much you use to:  0-1 drink a day for women.  0-2 drinks a day for men. ? Be aware of how much alcohol is in your drink. In the [place] one drink equals one 12 oz bottle of beer (355 mL), one 5 oz glass of wine (148 mL), or one 1 oz glass of hard liquor (44 mL). Lifestyle   Work with your doctor to stay at a healthy weight or to lose weight. Ask your doctor what the best weight is for you.  Get at least 30 minutes of exercise most days of the week. This may include walking, swimming, or biking.  Get at least 30 minutes of exercise that strengthens your muscles (resistance exercise) at least 3 days a week. This may include lifting weights or doing Pilates.  Do not use any products that contain nicotine or tobacco, such as cigarettes, e-cigarettes, and chewing tobacco. If you need help quitting, ask your doctor.  Check your blood pressure at home as told by your doctor.  Keep all follow-up visits as told by your doctor. This is important. Medicines  Take over-the-counter and prescription medicines only as told by your doctor. Follow directions carefully.  Do not skip doses of blood pressure medicine. The medicine does not work as well if you skip doses. Skipping doses also puts you at risk for problems.  Ask your doctor about side effects or reactions to medicines that you should watch for. Contact a doctor if you:  Think you are having a reaction to the medicine you are taking.  Have headaches that keep coming back (recurring).  Feel dizzy.  Have swelling in your ankles.  Have trouble with your vision. Get help right away if you:  Get a very bad headache.  Start to feel mixed up (confused).  Feel weak or numb.  Feel faint.  Have very bad pain in your: ? Chest. ? Belly (abdomen).  Throw up more than once.  Have trouble  breathing. Summary  Hypertension is another name for high blood pressure.  High blood pressure forces your heart to work harder to pump blood.  For most people, a normal blood pressure is less than 120/80.  Making healthy choices can help lower blood pressure. If your blood pressure does not get lower with healthy choices, you may need to take medicine. This information is not intended to replace advice given to you by your health care provider. Make sure you discuss any questions you have with your health care provider. Document Released: 09/08/2007 Document Revised: 11/30/2017 Document Reviewed: 11/30/2017 Elsevier Patient Education  2020 [employer].

## 2019-03-29 ENCOUNTER — Emergency Department
Admission: EM | Admit: 2019-03-29 | Discharge: 2019-03-29 | Disposition: A | Discharge status: Home / Self Care | Payer: Medicaid Other | Attending: Emergency Medicine | Admitting: Emergency Medicine

## 2019-03-29 ENCOUNTER — Other Ambulatory Visit: Payer: Self-pay

## 2019-03-29 ENCOUNTER — Encounter: Payer: Self-pay | Admitting: Emergency Medicine

## 2019-03-29 DIAGNOSIS — T50901A Poisoning by unspecified drugs, medicaments and biological substances, accidental (unintentional), initial encounter: Secondary | ICD-10-CM | POA: Insufficient documentation

## 2019-03-29 DIAGNOSIS — I10 Essential (primary) hypertension: Secondary | ICD-10-CM | POA: Insufficient documentation

## 2019-03-29 DIAGNOSIS — F17220 Nicotine dependence, chewing tobacco, uncomplicated: Secondary | ICD-10-CM | POA: Insufficient documentation

## 2019-03-29 DIAGNOSIS — Z79899 Other long term (current) drug therapy: Secondary | ICD-10-CM | POA: Insufficient documentation

## 2019-03-29 DIAGNOSIS — F17210 Nicotine dependence, cigarettes, uncomplicated: Secondary | ICD-10-CM | POA: Insufficient documentation

## 2019-03-29 LAB — CBC WITH DIFFERENTIAL/PLATELET
Abs Immature Granulocytes: 0.06 10*3/uL (ref 0.00–0.07)
Basophils Absolute: 0 10*3/uL (ref 0.0–0.1)
Basophils Relative: 0 %
Eosinophils Absolute: 0 10*3/uL (ref 0.0–0.5)
Eosinophils Relative: 0 %
HCT: 47.2 % (ref 39.0–52.0)
Hemoglobin: 16.9 g/dL (ref 13.0–17.0)
Immature Granulocytes: 1 %
Lymphocytes Relative: 15 %
Lymphs Abs: 1.1 10*3/uL (ref 0.7–4.0)
MCH: 31.9 pg (ref 26.0–34.0)
MCHC: 35.8 g/dL (ref 30.0–36.0)
MCV: 89.1 fL (ref 80.0–100.0)
Monocytes Absolute: 0.4 10*3/uL (ref 0.1–1.0)
Monocytes Relative: 5 %
Neutro Abs: 6.2 10*3/uL (ref 1.7–7.7)
Neutrophils Relative %: 79 %
Platelets: 187 10*3/uL (ref 150–400)
RBC: 5.3 MIL/uL (ref 4.22–5.81)
RDW: 13.3 % (ref 11.5–15.5)
WBC: 7.8 10*3/uL (ref 4.0–10.5)
nRBC: 0 % (ref 0.0–0.2)

## 2019-03-29 LAB — URINALYSIS, COMPLETE (UACMP) WITH MICROSCOPIC
Bilirubin Urine: NEGATIVE
Glucose, UA: 150 mg/dL — AB
Hgb urine dipstick: NEGATIVE
Ketones, ur: 5 mg/dL — AB
Leukocytes,Ua: NEGATIVE
Nitrite: NEGATIVE
Protein, ur: 30 mg/dL — AB
Specific Gravity, Urine: 1.014 (ref 1.005–1.030)
pH: 6 (ref 5.0–8.0)

## 2019-03-29 LAB — TROPONIN I (HIGH SENSITIVITY)
Troponin I (High Sensitivity): 5 ng/L (ref <18)
Troponin I (High Sensitivity): 5 ng/L (ref <18)

## 2019-03-29 LAB — COMPREHENSIVE METABOLIC PANEL
ALT: 18 U/L (ref 0–44)
AST: 25 U/L (ref 15–41)
Albumin: 4.3 g/dL (ref 3.5–5.0)
Alkaline Phosphatase: 60 U/L (ref 38–126)
Anion gap: 14 (ref 5–15)
BUN: 16 mg/dL (ref 6–20)
CO2: 21 mmol/L — ABNORMAL LOW (ref 22–32)
Calcium: 9.2 mg/dL (ref 8.9–10.3)
Chloride: 106 mmol/L (ref 98–111)
Creatinine, Ser: 0.9 mg/dL (ref 0.61–1.24)
GFR calc Af Amer: >60 mL/min (ref >60)
GFR calc non Af Amer: >60 mL/min (ref >60)
Glucose, Bld: 136 mg/dL — ABNORMAL HIGH (ref 70–99)
Potassium: 4 mmol/L (ref 3.5–5.1)
Sodium: 141 mmol/L (ref 135–145)
Total Bilirubin: 0.5 mg/dL (ref 0.3–1.2)
Total Protein: 8 g/dL (ref 6.5–8.1)

## 2019-03-29 LAB — URINE DRUG SCREEN, QUALITATIVE (ARMC ONLY)
Amphetamines, Ur Screen: NOT DETECTED
Barbiturates, Ur Screen: NOT DETECTED
Benzodiazepine, Ur Scrn: POSITIVE — AB
Cannabinoid 50 Ng, Ur ~~LOC~~: NOT DETECTED
Cocaine Metabolite,Ur ~~LOC~~: NOT DETECTED
MDMA (Ecstasy)Ur Screen: NOT DETECTED
Methadone Scn, Ur: NOT DETECTED
Opiate, Ur Screen: NOT DETECTED
Phencyclidine (PCP) Ur S: NOT DETECTED
Tricyclic, Ur Screen: NOT DETECTED

## 2019-03-29 NOTE — ED Notes (Signed)
MD talking to pt. At bedside.

## 2019-03-29 NOTE — ED Notes (Signed)
BEHAVIORAL HEALTH ROUNDING Patient sleeping: No. Patient alert and oriented: yes Behavior appropriate: Yes.  ; If no, describe:  Nutrition and fluids offered: yes Toileting and hygiene offered: Yes  Sitter present: q15 minute observations  

## 2019-03-29 NOTE — ED Notes (Signed)
Pt. Taken to lobby to wait for ride from family.

## 2019-03-29 NOTE — ED Notes (Signed)
This nurse attempted to straight stick pt and obtain a light green for second troponin, and was unsuccessful. This nurse called lab to come get blood and they stated that they would.

## 2019-03-29 NOTE — ED Provider Notes (Signed)
3:49 PM Assumed care for off going team.   Blood pressure 114/81, pulse 86, height 5\' 4"  (1.626 m), weight 59 kg, SpO2 100 %.  See their HPI for full report but in brief Labs pending, UA, then can d/c.Found down better with narcan. Yellow pills on floor. He doesn't know what happened. Better with narcan.   Cardiac markers negative x2.  Reevaluated patient is feeling at his baseline.  Patient is a ride home.  Will discharge home       Vanessa Iola, MD 03/29/19 [phone]

## 2019-03-29 NOTE — ED Notes (Signed)
Pt. Asking about going home.  Pt. Given cup of water upon request.

## 2019-03-29 NOTE — Discharge Instructions (Addendum)
Your work-up was reassuring.  Do not use any substances.  Return to the ER for any other concerns

## 2019-03-29 NOTE — ED Notes (Signed)
Pt. Calling family for a ride.  Pt. States family will be here shortly.

## 2019-03-29 NOTE — ED Triage Notes (Signed)
Pt arrived from work via [employer]. Reports that when they got on scene pt was in respiratory arrest and EMS gave him 4 mg Narcan nasal and pt began to wake up. States that someone found yellow pills near pt and they are trying to ID what those pills are.

## 2019-03-29 NOTE — ED Provider Notes (Signed)
[facility] Emergency Department Provider Note   ____________________________________________   None    (approximate)  I have reviewed the triage vital signs and the nursing notes.   HISTORY  Chief Complaint Drug Overdose   HP Brad [name] is a 53 y.o. male who was found down at work.  EMS reports he was not breathing was blue.  He woke up with Narcan.  There were some yellow pills on the floor near him.  Apparently he was looking for pain pills at his parents house because he had some leg pain.  Patient herself says he is not sure what is happened.  He does not appear to remember what happened either.  He says he did not use any drugs.  He is feeling okay now he says.         Past Medical History:  Diagnosis Date  . Chronic kidney disease   . Medical history non-contributory   . Painful orthopaedic hardware, right ankle 12/06/2013    Patient Active Problem List   Diagnosis Date Noted  . Benign essential HTN 02/05/2019  . Painful orthopaedic hardware, right ankle 12/06/2013    Past Surgical History:  Procedure Laterality Date  . HARDWARE REMOVAL Right 12/06/2013   Procedure: RIGHT ANKLE: REMOVAL RETAINED HARDWARE ;  Surgeon: Johnny Bridge, MD;  Location: [facility];  Service: Orthopedics;  Laterality: Right;  . HERNIA REPAIR  1990  . ORIF ANKLE FRACTURE  2000   right  . ORIF WRIST FRACTURE  2005   left    Prior to Admission medications   Medication Sig Start Date End Date Taking? Authorizing Provider  amLODipine (NORVASC) 5 MG tablet Take 1 tablet (5 mg total) by mouth daily. 02/06/19 05/07/19  [place] Eric J, DO    Allergies Patient has no known allergies.  History reviewed. No pertinent family history.  Social History Social History   Tobacco Use  . Smoking status: Current Every Day Smoker    Packs/day: 1.00    Types: Cigarettes  . Smokeless tobacco: Current User    Types: Snuff  Substance Use Topics  .  Alcohol use: No  . Drug use: No    Review of Systems  Constitutional: No fever/chills Eyes: No visual changes. ENT: No sore throat. Cardiovascular: Denies chest pain. Respiratory: Denies shortness of breath. Gastrointestinal: No abdominal pain.  No nausea, no vomiting.  No diarrhea.  No constipation. Genitourinary: Negative for dysuria. Musculoskeletal: Negative for back pain. Skin: Negative for rash. Neurological: Negative for headaches, focal weakness ____________________________________________   PHYSICAL EXAM:  VITAL SIGNS: ED Triage Vitals  Enc Vitals Group     BP 03/29/19 1243 114/81     Pulse Rate 03/29/19 1243 86     Resp --      Temp --      Temp Source 03/29/19 1243 Oral     SpO2 03/29/19 1243 100 %     Weight 03/29/19 1234 130 lb (59 kg)     Height 03/29/19 1234 5\' 4"  (1.626 m)     Head Circumference --      Peak Flow --      Pain Score 03/29/19 1234 0     Pain Loc --      Pain Edu? --      Excl. in [place]? --     Constitutional: Alert and oriented. Well appearing and in no acute distress.  He does not appear to look really alert though he is just  kind of staring off into space most of the time. Eyes: Conjunctivae are normal. PER. EOMI. pupils are not pinpoint Head: Atraumatic. Nose: No congestion/rhinnorhea. Mouth/Throat: Mucous membranes are moist.  Oropharynx non-erythematous. Neck: No stridor.   Cardiovascular: Normal rate, regular rhythm. Grossly normal heart sounds.  Good peripheral circulation. Respiratory: Normal respiratory effort.  No retractions. Lungs CTAB. Gastrointestinal: Soft and nontender. No distention. No abdominal bruits. No CVA tenderness. Musculoskeletal: No lower extremity tenderness nor edema.  Neurologic:  Normal speech and language. No gross focal neurologic deficits are appreciated. No gait instability. Skin:  Skin is warm, dry and intact. No rash noted.   ____________________________________________   LABS (all labs ordered  are listed, but only abnormal results are displayed)  Labs Reviewed  CBC WITH DIFFERENTIAL/PLATELET  URINALYSIS, COMPLETE (UACMP) WITH MICROSCOPIC  URINE DRUG SCREEN, QUALITATIVE ([facility] ONLY)  COMPREHENSIVE METABOLIC PANEL  TROPONIN I (HIGH SENSITIVITY)  TROPONIN I (HIGH SENSITIVITY)   ____________________________________________  EKG  EKG read interpreted by me shows sinus rhythm at 79 normal axis nonspecific ST-T wave changes slightly prolonged QTC at 497 ms _______________________________________  RADIOLOGY  ED MD interpretation:   Official radiology report(s): No results found.  ____________________________________________   PROCEDURES  Procedure(s) performed (including Critical Care):  Procedures   ____________________________________________   INITIAL IMPRESSION / ASSESSMENT AND PLAN / ED COURSE  [name] was evaluated in Emergency Department on 03/29/2019 for the symptoms described in the history of present illness. He was evaluated in the context of the global COVID-19 pandemic, which necessitated consideration that the patient might be at risk for infection with the SARS-CoV-2 virus that causes COVID-19. Institutional protocols and algorithms that pertain to the evaluation of patients at risk for COVID-19 are in a state of rapid change based on information released by regulatory bodies including the CDC and federal and state organizations. These policies and algorithms were followed during the patient's care in the ED. Troponin and CMP are still pending.  Patient is much more awake.  Assuming the labs come back normal I will plan on discharging this patient.  I am signing this out to the oncoming provider.          ____________________________________________   FINAL CLINICAL IMPRESSION(S) / ED DIAGNOSES  Final diagnoses:  Accidental overdose, initial encounter     ED Discharge Orders    None       Note:  This document was prepared  using Dragon voice recognition software and may include unintentional dictation errors.    Arnaldo Natal, MD 03/29/19 1536

## 2019-03-29 NOTE — ED Notes (Signed)
Vol/Medical

## 2019-08-04 DEATH — deceased

## 2020-06-11 IMAGING — DX DG CHEST 1V PORT
1 series · 1 of 1 positions shown · non-contrast
Comparison: None.

CLINICAL DATA: Overdose

EXAM:
PORTABLE CHEST 1 VIEW

[chest ap]
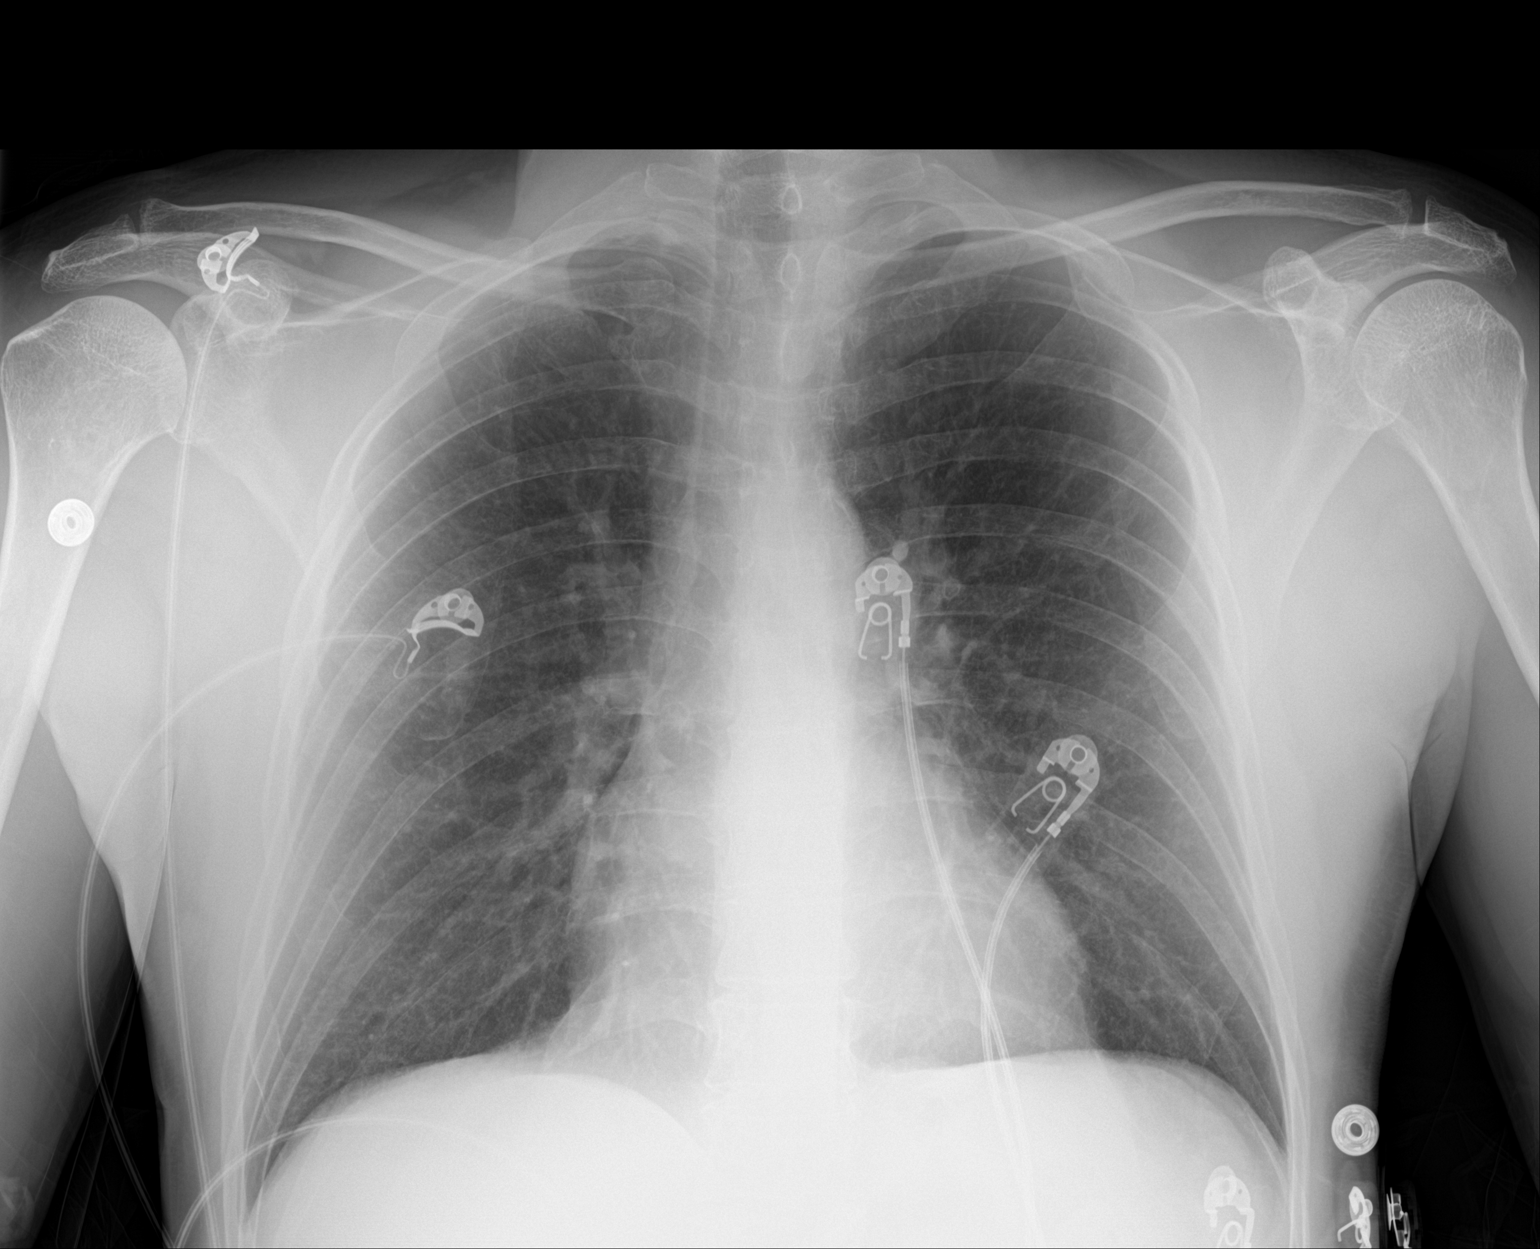

[1 of 1 positions shown; findings below may reference images not displayed]

FINDINGS: The heart size and mediastinal contours are within normal limits.
Both lungs are clear. The visualized skeletal structures are
unremarkable.
IMPRESSION: No acute abnormality of the lungs in AP portable projection.
